# Patient Record
Sex: Female | Born: 1950
Health system: Southern US, Community
[De-identification: ages and names within clinical notes are randomized; demographics above are authoritative.]

## PROBLEM LIST (undated history)

## (undated) DIAGNOSIS — I10 Essential (primary) hypertension: Secondary | ICD-10-CM

## (undated) DIAGNOSIS — E669 Obesity, unspecified: Secondary | ICD-10-CM

## (undated) DIAGNOSIS — M8580 Other specified disorders of bone density and structure, unspecified site: Secondary | ICD-10-CM

## (undated) DIAGNOSIS — F329 Major depressive disorder, single episode, unspecified: Secondary | ICD-10-CM

## (undated) DIAGNOSIS — E785 Hyperlipidemia, unspecified: Secondary | ICD-10-CM

## (undated) DIAGNOSIS — F32A Depression, unspecified: Secondary | ICD-10-CM

## (undated) DIAGNOSIS — R7302 Impaired glucose tolerance (oral): Secondary | ICD-10-CM

## (undated) DIAGNOSIS — F321 Major depressive disorder, single episode, moderate: Secondary | ICD-10-CM

## (undated) HISTORY — DX: Impaired glucose tolerance (oral): R73.02

## (undated) HISTORY — DX: Hyperlipidemia, unspecified: E78.5

## (undated) HISTORY — PX: TUBAL LIGATION: SHX77

## (undated) HISTORY — DX: Other specified disorders of bone density and structure, unspecified site: M85.80

## (undated) HISTORY — DX: Essential (primary) hypertension: I10

## (undated) HISTORY — DX: Major depressive disorder, single episode, unspecified: F32.9

## (undated) HISTORY — DX: Depression, unspecified: F32.A

## (undated) HISTORY — PX: PARTIAL HYSTERECTOMY: SHX80

## (undated) HISTORY — DX: Obesity, unspecified: E66.9

---
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
## 1973-09-20 HISTORY — PX: ABDOMINAL HYSTERECTOMY: SHX81

## 1998-05-17 ENCOUNTER — Emergency Department (HOSPITAL_COMMUNITY): Admission: EM | Admit: 1998-05-17 | Discharge: 1998-05-17 | Payer: Self-pay | Admitting: Emergency Medicine

## 2000-03-20 ENCOUNTER — Emergency Department (HOSPITAL_COMMUNITY): Admission: EM | Admit: 2000-03-20 | Discharge: 2000-03-20 | Payer: Self-pay | Admitting: Emergency Medicine

## 2000-11-13 ENCOUNTER — Emergency Department (HOSPITAL_COMMUNITY): Admission: EM | Admit: 2000-11-13 | Discharge: 2000-11-13 | Payer: Self-pay | Admitting: Emergency Medicine

## 2000-11-13 ENCOUNTER — Encounter: Payer: Self-pay | Admitting: Emergency Medicine

## 2001-01-31 ENCOUNTER — Other Ambulatory Visit: Admission: RE | Admit: 2001-01-31 | Discharge: 2001-01-31 | Payer: Self-pay | Admitting: Family Medicine

## 2002-02-14 ENCOUNTER — Encounter: Payer: Self-pay | Admitting: Family Medicine

## 2002-02-14 ENCOUNTER — Ambulatory Visit (HOSPITAL_COMMUNITY): Admission: RE | Admit: 2002-02-14 | Discharge: 2002-02-14 | Payer: Self-pay | Admitting: Family Medicine

## 2003-04-05 ENCOUNTER — Ambulatory Visit (HOSPITAL_COMMUNITY): Admission: RE | Admit: 2003-04-05 | Discharge: 2003-04-05 | Payer: Self-pay | Admitting: Family Medicine

## 2003-04-05 ENCOUNTER — Encounter: Payer: Self-pay | Admitting: Family Medicine

## 2004-09-02 ENCOUNTER — Ambulatory Visit (HOSPITAL_COMMUNITY): Admission: RE | Admit: 2004-09-02 | Discharge: 2004-09-02 | Payer: Self-pay | Admitting: Family Medicine

## 2004-10-15 ENCOUNTER — Ambulatory Visit: Payer: Self-pay | Admitting: Family Medicine

## 2005-03-24 ENCOUNTER — Ambulatory Visit: Payer: Self-pay | Admitting: Family Medicine

## 2005-08-16 ENCOUNTER — Ambulatory Visit: Payer: Self-pay | Admitting: Family Medicine

## 2005-09-24 ENCOUNTER — Ambulatory Visit (HOSPITAL_COMMUNITY): Admission: RE | Admit: 2005-09-24 | Discharge: 2005-09-24 | Payer: Self-pay | Admitting: Family Medicine

## 2005-11-04 ENCOUNTER — Ambulatory Visit: Payer: Self-pay | Admitting: Family Medicine

## 2005-11-09 ENCOUNTER — Encounter (INDEPENDENT_AMBULATORY_CARE_PROVIDER_SITE_OTHER): Payer: Self-pay | Admitting: *Deleted

## 2005-11-09 LAB — CONVERTED CEMR LAB: Pap Smear: NORMAL

## 2006-03-16 ENCOUNTER — Ambulatory Visit: Payer: Self-pay | Admitting: Family Medicine

## 2006-06-25 ENCOUNTER — Emergency Department (HOSPITAL_COMMUNITY): Admission: EM | Admit: 2006-06-25 | Discharge: 2006-06-25 | Payer: Self-pay | Admitting: Emergency Medicine

## 2006-10-21 ENCOUNTER — Ambulatory Visit: Payer: Self-pay | Admitting: Family Medicine

## 2006-10-21 LAB — CONVERTED CEMR LAB
AST: 19 units/L (ref 0–37)
Alkaline Phosphatase: 77 units/L (ref 39–117)
BUN: 10 mg/dL (ref 6–23)
Basophils Absolute: 0 10*3/uL (ref 0.0–0.1)
Basophils Relative: 0 % (ref 0–1)
Calcium: 9.5 mg/dL (ref 8.4–10.5)
Cholesterol: 177 mg/dL (ref 0–200)
Creatinine, Ser: 0.62 mg/dL (ref 0.40–1.20)
Eosinophils Absolute: 0.2 10*3/uL (ref 0.0–0.7)
Eosinophils Relative: 2 % (ref 0–5)
Hemoglobin: 13.3 g/dL (ref 12.0–15.0)
Indirect Bilirubin: 0.4 mg/dL (ref 0.0–0.9)
LDL Cholesterol: 106 mg/dL — ABNORMAL HIGH (ref 0–99)
MCHC: 31.9 g/dL (ref 30.0–36.0)
MCV: 91.2 fL (ref 78.0–100.0)
Monocytes Absolute: 0.4 10*3/uL (ref 0.2–0.7)
Monocytes Relative: 5 % (ref 3–11)
RBC: 4.57 M/uL (ref 3.87–5.11)
RDW: 12.9 % (ref 11.5–14.0)
Total Protein: 7.2 g/dL (ref 6.0–8.3)
Triglycerides: 59 mg/dL (ref <150)

## 2007-02-08 ENCOUNTER — Ambulatory Visit (HOSPITAL_COMMUNITY): Admission: RE | Admit: 2007-02-08 | Discharge: 2007-02-08 | Payer: Self-pay | Admitting: Family Medicine

## 2007-03-01 ENCOUNTER — Ambulatory Visit (HOSPITAL_COMMUNITY): Admission: RE | Admit: 2007-03-01 | Discharge: 2007-03-01 | Payer: Self-pay | Admitting: Family Medicine

## 2007-04-04 ENCOUNTER — Ambulatory Visit: Payer: Self-pay | Admitting: Family Medicine

## 2007-04-04 ENCOUNTER — Encounter (INDEPENDENT_AMBULATORY_CARE_PROVIDER_SITE_OTHER): Payer: Self-pay | Admitting: *Deleted

## 2007-04-04 ENCOUNTER — Encounter: Payer: Self-pay | Admitting: Family Medicine

## 2007-04-04 ENCOUNTER — Other Ambulatory Visit: Admission: RE | Admit: 2007-04-04 | Discharge: 2007-04-04 | Payer: Self-pay | Admitting: Family Medicine

## 2007-04-04 LAB — CONVERTED CEMR LAB: Pap Smear: NORMAL

## 2007-04-05 ENCOUNTER — Encounter: Payer: Self-pay | Admitting: Family Medicine

## 2007-04-05 LAB — CONVERTED CEMR LAB
ALT: 17 units/L (ref 0–35)
BUN: 12 mg/dL (ref 6–23)
Bilirubin, Direct: 0.2 mg/dL (ref 0.0–0.3)
Cholesterol: 158 mg/dL (ref 0–200)
Creatinine, Ser: 0.72 mg/dL (ref 0.40–1.20)
Glucose, Bld: 97 mg/dL (ref 70–99)
Indirect Bilirubin: 0.2 mg/dL (ref 0.0–0.9)
LDL Cholesterol: 87 mg/dL (ref 0–99)
Potassium: 4.1 meq/L (ref 3.5–5.3)
VLDL: 13 mg/dL (ref 0–40)

## 2007-04-18 ENCOUNTER — Ambulatory Visit (HOSPITAL_COMMUNITY): Admission: RE | Admit: 2007-04-18 | Discharge: 2007-04-18 | Payer: Self-pay | Admitting: Family Medicine

## 2007-05-16 ENCOUNTER — Ambulatory Visit: Payer: Self-pay | Admitting: Family Medicine

## 2007-08-04 ENCOUNTER — Ambulatory Visit: Payer: Self-pay | Admitting: Family Medicine

## 2007-08-04 LAB — CONVERTED CEMR LAB
Bilirubin, Direct: 0.1 mg/dL (ref 0.0–0.3)
CO2: 28 meq/L (ref 19–32)
Glucose, Bld: 89 mg/dL (ref 70–99)
Potassium: 4 meq/L (ref 3.5–5.3)
Sodium: 141 meq/L (ref 135–145)
Total Bilirubin: 0.5 mg/dL (ref 0.3–1.2)
Total CHOL/HDL Ratio: 3
VLDL: 15 mg/dL (ref 0–40)

## 2007-09-11 ENCOUNTER — Encounter (INDEPENDENT_AMBULATORY_CARE_PROVIDER_SITE_OTHER): Payer: Self-pay | Admitting: *Deleted

## 2008-01-02 DIAGNOSIS — M949 Disorder of cartilage, unspecified: Secondary | ICD-10-CM

## 2008-01-02 DIAGNOSIS — E785 Hyperlipidemia, unspecified: Secondary | ICD-10-CM | POA: Insufficient documentation

## 2008-01-02 DIAGNOSIS — M899 Disorder of bone, unspecified: Secondary | ICD-10-CM | POA: Insufficient documentation

## 2008-01-02 DIAGNOSIS — E669 Obesity, unspecified: Secondary | ICD-10-CM

## 2008-01-02 DIAGNOSIS — I10 Essential (primary) hypertension: Secondary | ICD-10-CM | POA: Insufficient documentation

## 2008-01-03 ENCOUNTER — Ambulatory Visit: Payer: Self-pay | Admitting: Family Medicine

## 2008-01-03 LAB — CONVERTED CEMR LAB
Albumin: 4.6 g/dL (ref 3.5–5.2)
Alkaline Phosphatase: 79 units/L (ref 39–117)
Basophils Absolute: 0 10*3/uL (ref 0.0–0.1)
CO2: 26 meq/L (ref 19–32)
Chloride: 103 meq/L (ref 96–112)
Creatinine, Ser: 0.75 mg/dL (ref 0.40–1.20)
HDL: 63 mg/dL (ref >39)
Hemoglobin: 12.7 g/dL (ref 12.0–15.0)
LDL Cholesterol: 110 mg/dL — ABNORMAL HIGH (ref 0–99)
Lymphocytes Relative: 31 % (ref 12–46)
Monocytes Absolute: 0.4 10*3/uL (ref 0.1–1.0)
Monocytes Relative: 5 % (ref 3–12)
Neutro Abs: 5.1 10*3/uL (ref 1.7–7.7)
RBC: 4.35 M/uL (ref 3.87–5.11)
Total Bilirubin: 0.4 mg/dL (ref 0.3–1.2)
WBC: 8.4 10*3/uL (ref 4.0–10.5)

## 2008-06-28 ENCOUNTER — Ambulatory Visit: Payer: Self-pay | Admitting: Family Medicine

## 2008-06-28 LAB — CONVERTED CEMR LAB
AST: 16 units/L (ref 0–37)
Albumin: 4.5 g/dL (ref 3.5–5.2)
Alkaline Phosphatase: 79 units/L (ref 39–117)
Calcium: 9.4 mg/dL (ref 8.4–10.5)
Creatinine, Ser: 0.75 mg/dL (ref 0.40–1.20)
HDL: 62 mg/dL (ref >39)
Total Protein: 8.2 g/dL (ref 6.0–8.3)
Triglycerides: 70 mg/dL (ref <150)

## 2008-06-29 ENCOUNTER — Encounter: Payer: Self-pay | Admitting: Family Medicine

## 2008-10-30 ENCOUNTER — Telehealth: Payer: Self-pay | Admitting: Gastroenterology

## 2008-10-31 ENCOUNTER — Encounter (INDEPENDENT_AMBULATORY_CARE_PROVIDER_SITE_OTHER): Payer: Self-pay | Admitting: *Deleted

## 2008-11-25 ENCOUNTER — Encounter: Payer: Self-pay | Admitting: Family Medicine

## 2009-04-03 ENCOUNTER — Ambulatory Visit: Payer: Self-pay | Admitting: Family Medicine

## 2009-04-03 DIAGNOSIS — F329 Major depressive disorder, single episode, unspecified: Secondary | ICD-10-CM

## 2009-04-04 LAB — CONVERTED CEMR LAB
ALT: 15 units/L (ref 0–35)
AST: 17 units/L (ref 0–37)
Alkaline Phosphatase: 78 units/L (ref 39–117)
BUN: 13 mg/dL (ref 6–23)
Basophils Absolute: 0 10*3/uL (ref 0.0–0.1)
Basophils Relative: 0 % (ref 0–1)
Calcium: 9.1 mg/dL (ref 8.4–10.5)
Cholesterol: 157 mg/dL (ref 0–200)
Eosinophils Relative: 3 % (ref 0–5)
HCT: 40.8 % (ref 36.0–46.0)
Indirect Bilirubin: 0.4 mg/dL (ref 0.0–0.9)
Lymphocytes Relative: 35 % (ref 12–46)
Neutro Abs: 4 10*3/uL (ref 1.7–7.7)
Platelets: 287 10*3/uL (ref 150–400)
Potassium: 4.2 meq/L (ref 3.5–5.3)
RDW: 13.2 % (ref 11.5–15.5)
Total Protein: 7 g/dL (ref 6.0–8.3)
Triglycerides: 76 mg/dL (ref <150)
VLDL: 15 mg/dL (ref 0–40)

## 2009-04-10 ENCOUNTER — Telehealth: Payer: Self-pay | Admitting: Family Medicine

## 2009-11-06 ENCOUNTER — Ambulatory Visit: Payer: Self-pay | Admitting: Family Medicine

## 2009-11-07 ENCOUNTER — Encounter: Payer: Self-pay | Admitting: Family Medicine

## 2009-11-07 LAB — CONVERTED CEMR LAB
ALT: 22 units/L (ref 0–35)
AST: 21 units/L (ref 0–37)
BUN: 10 mg/dL (ref 6–23)
Bilirubin, Direct: 0.1 mg/dL (ref 0.0–0.3)
CO2: 28 meq/L (ref 19–32)
Calcium: 9.2 mg/dL (ref 8.4–10.5)
Cholesterol: 165 mg/dL (ref 0–200)
Creatinine, Ser: 0.62 mg/dL (ref 0.40–1.20)
Glucose, Bld: 87 mg/dL (ref 70–99)
Indirect Bilirubin: 0.5 mg/dL (ref 0.0–0.9)
Total Bilirubin: 0.6 mg/dL (ref 0.3–1.2)

## 2009-11-10 ENCOUNTER — Telehealth: Payer: Self-pay | Admitting: Family Medicine

## 2009-12-02 ENCOUNTER — Telehealth: Payer: Self-pay | Admitting: Family Medicine

## 2010-01-21 ENCOUNTER — Telehealth: Payer: Self-pay | Admitting: Gastroenterology

## 2010-06-09 ENCOUNTER — Telehealth: Payer: Self-pay | Admitting: Family Medicine

## 2010-06-30 ENCOUNTER — Ambulatory Visit: Payer: Self-pay | Admitting: Family Medicine

## 2010-07-01 LAB — CONVERTED CEMR LAB
ALT: 15 units/L (ref 0–35)
AST: 16 units/L (ref 0–37)
Albumin: 4.5 g/dL (ref 3.5–5.2)
Alkaline Phosphatase: 82 units/L (ref 39–117)
BUN: 11 mg/dL (ref 6–23)
Basophils Absolute: 0 10*3/uL (ref 0.0–0.1)
Basophils Relative: 0 % (ref 0–1)
Bilirubin, Direct: 0.3 mg/dL (ref 0.0–0.3)
CO2: 29 meq/L (ref 19–32)
Calcium: 9.7 mg/dL (ref 8.4–10.5)
Chloride: 104 meq/L (ref 96–112)
Cholesterol: 168 mg/dL (ref 0–200)
Creatinine, Ser: 0.67 mg/dL (ref 0.40–1.20)
Eosinophils Absolute: 0.2 10*3/uL (ref 0.0–0.7)
Eosinophils Relative: 3 % (ref 0–5)
Glucose, Bld: 94 mg/dL (ref 70–99)
HCT: 41.2 % (ref 36.0–46.0)
HDL: 56 mg/dL (ref >39)
Hemoglobin: 13.3 g/dL (ref 12.0–15.0)
Indirect Bilirubin: 0.1 mg/dL (ref 0.0–0.9)
LDL Cholesterol: 100 mg/dL — ABNORMAL HIGH (ref 0–99)
Lymphocytes Relative: 28 % (ref 12–46)
Lymphs Abs: 2.3 10*3/uL (ref 0.7–4.0)
MCHC: 32.3 g/dL (ref 30.0–36.0)
MCV: 91.4 fL (ref 78.0–100.0)
Monocytes Absolute: 0.5 10*3/uL (ref 0.1–1.0)
Monocytes Relative: 6 % (ref 3–12)
Neutro Abs: 5.2 10*3/uL (ref 1.7–7.7)
Neutrophils Relative %: 63 % (ref 43–77)
Platelets: 312 10*3/uL (ref 150–400)
Potassium: 4.1 meq/L (ref 3.5–5.3)
RBC: 4.51 M/uL (ref 3.87–5.11)
RDW: 13.1 % (ref 11.5–15.5)
Sodium: 142 meq/L (ref 135–145)
TSH: 1.163 microintl units/mL (ref 0.350–4.500)
Total Bilirubin: 0.4 mg/dL (ref 0.3–1.2)
Total CHOL/HDL Ratio: 3
Total Protein: 7.7 g/dL (ref 6.0–8.3)
Triglycerides: 60 mg/dL (ref <150)
VLDL: 12 mg/dL (ref 0–40)
Vit D, 25-Hydroxy: 22 ng/mL — ABNORMAL LOW (ref 30–89)
WBC: 8.2 10*3/uL (ref 4.0–10.5)

## 2010-07-07 ENCOUNTER — Encounter: Payer: Self-pay | Admitting: Family Medicine

## 2010-07-09 ENCOUNTER — Telehealth: Payer: Self-pay | Admitting: Family Medicine

## 2010-10-10 ENCOUNTER — Encounter: Payer: Self-pay | Admitting: Family Medicine

## 2010-10-11 ENCOUNTER — Encounter: Payer: Self-pay | Admitting: Family Medicine

## 2010-10-12 ENCOUNTER — Encounter: Payer: Self-pay | Admitting: Family Medicine

## 2010-10-20 NOTE — Letter (Signed)
Summary: [facility]   Imported By: Lind Guest 07/07/2010 14:42:59  _____________________________________________________________________  External Attachment:    Type:   Image     Comment:   External Document

## 2010-10-20 NOTE — Progress Notes (Signed)
Summary: medication  Phone Note Call from Patient   Summary of Call: having trouble with medicine. talking about phentermine. not taking now. can you give her something to have bowel movement. [phone] Initial call taken by: Rudene Anda,  December 02, 2009 10:29 AM  Follow-up for Phone Call        advise all laxatives are otc and she may follow the directions, she may use dulcolax tablet or suppositories, alsoshe may use milk of magnesia or magnesium citrate is the strongest. i recommend most the dulcolax  Follow-up by: Syliva Overman MD,  December 03, 2009 12:44 PM  Additional Follow-up for Phone Call Additional follow up Details #1::        Phone Call Completed Additional Follow-up by: Adella Hare LPN,  December 03, 2009 1:28 PM

## 2010-10-20 NOTE — Progress Notes (Signed)
Summary: simvastatin  Phone Note Call from Patient   Summary of Call: patient called in and wanted to know if she could cut her simastain in half, she states she has some 80 mg at home.  She is only suppose to take 40 mg.  I went and spoke to Craig and she said to advise patient that if she wanted to take the 80mg  to do so every other day.   Initial call taken by: Curtis Sites,  July 09, 2010 2:20 PM

## 2010-10-20 NOTE — Letter (Signed)
Summary: med review sheet  med review sheet   Imported By: Rudene Anda 06/30/2010 11:55:12  _____________________________________________________________________  External Attachment:    Type:   Image     Comment:   External Document

## 2010-10-20 NOTE — Progress Notes (Signed)
  Phone Note Call from Patient   Caller: Patient Summary of Call: patient states you were gonna start her on a depression/anxiety med, her discharge summary also states this, however no med was ordered Initial call taken by: Adella Hare LPN,  November 10, 2009 11:30 AM  Follow-up for Phone Call        advise sorry, and let her know med sent in , venlafexine Follow-up by: Syliva Overman MD,  November 10, 2009 12:11 PM  Additional Follow-up for Phone Call Additional follow up Details #1::        Rx Called In, patient aware Additional Follow-up by: Adella Hare LPN,  November 10, 2009 4:23 PM    New/Updated Medications: VENLAFAXINE HCL 37.5 MG TABS (VENLAFAXINE HCL) Take 1 tablet by mouth two times a day Prescriptions: VENLAFAXINE HCL 37.5 MG TABS (VENLAFAXINE HCL) Take 1 tablet by mouth two times a day  #60 x 3   Entered and Authorized by:   Syliva Overman MD   Signed by:   Syliva Overman MD on 11/10/2009   Method used:   [facility]  [address] [id number]       [address]       [place], [place]  [address]       Ph: [phone] or [phone]       Fax: [phone]   RxID:   [id number]

## 2010-10-20 NOTE — Progress Notes (Signed)
Summary: MEDICINE  Phone Note Call from Patient   Summary of Call: NEEDS [facility] [address] Initial call taken by: Lind Guest,  June 09, 2010 10:01 AM    Prescriptions: SIMVASTATIN 80 MG  TABS (SIMVASTATIN) one tab by mouth once daily  #30 Tablet x 0   Entered by:   Adella Hare LPN   Authorized by:   Syliva Overman MD   Signed by:   Adella Hare LPN on 47/82/9562   Method used:   [facility]  [address] [id number]       [address]       [place], [place]  [address]       Ph: [phone] or [phone]       Fax: [phone]   RxID:   [id number]

## 2010-10-20 NOTE — Assessment & Plan Note (Signed)
Summary: office visit   Vital Signs:  Patient profile:   60 year old female Menstrual status:  hysterectomy Height:      64 inches Weight:      200.25 pounds BMI:     34.50 O2 Sat:      96 % Pulse rate:   92 / minute Pulse rhythm:   regular Resp:     16 per minute BP sitting:   110 / 70  (left arm) Cuff size:   large  Vitals Entered By: Everitt Amber LPN (November 06, 2009 1:01 PM)  Nutrition Counseling: Patient's BMI is greater than 25 and therefore counseled on weight management options. CC: Follow up chronic problems Is Patient Diabetic? No   CC:  Follow up chronic problems.  History of Present Illness: Reports  that she has been  doing well. Denies recent fever or chills. Denies sinus pressure, nasal congestion , ear pain or sore throat. Denies chest congestion, or cough productive of sputum. Denies chest pain, palpitations, PND, orthopnea or leg swelling. Denies abdominal pain, nausea, vomitting, diarrhea or constipation. Denies change in bowel movements or bloody stool. Denies dysuria , frequency, incontinence or hesitancy. Denies  joint pain, swelling, or reduced mobility. Denies headaches, vertigo, seizures.reports worsened depresssion, anxiety aand wants to resume meds. Life stresses are getting yto her, she is not suicidal or homicidal.  She is smoking more than in the past top her distress, she wants to lose weight.  Denies  rash, lesions, or itch.     Preventive Screening-Counseling & Management  Alcohol-Tobacco     Smoking Cessation Counseling: yes  Current Medications (verified): 1)  Simvastatin 80 Mg  Tabs (Simvastatin) .... One Tab By Mouth Once Daily 2)  Calcium 600/vitamin D 600-400 Mg-Unit  Tabs (Calcium Carbonate-Vitamin D) .... One Tab By Mouth Once Daily 3)  Aspirin 81 Mg  Tbec (Aspirin) .... One Tab By Mouth Once Daily 4)  Wellbutrin Sr 150 Mg Xr12h-Tab (Bupropion Hcl) .... One Tab By Mouth Qd 5)  Diovan Hct 160-25 Mg Tabs  (Valsartan-Hydrochlorothiazide) .... Take 1 Tablet By Mouth Once A Day  Allergies (verified): No Known Drug Allergies  Review of Systems Eyes:  Denies blurring and discharge. Psych:  Complains of anxiety and depression; denies easily tearful, irritability, mental problems, suicidal thoughts/plans, thoughts of violence, and unusual visions or sounds. Endo:  Denies cold intolerance, excessive hunger, excessive thirst, excessive urination, heat intolerance, polyuria, and weight change. Heme:  Denies abnormal bruising and bleeding. Allergy:  Denies hives or rash and sneezing.  Physical Exam  General:  alert, well-hydrated, and overweight-appearing. HEENT: No facial asymmetry,  EOMI, No sinus tenderness,  oropharynx  pink and moist.tM clear  Chest: Clear to auscultation bilaterally. decreased air entry bilaterally  CVS: S1, S2, No murmurs, No S3.   Abd: Soft, Nontender. obese MS: Adequate ROM spine, hips, shoulders and knees.  Ext: No edema.   CNS: CN 2-12 intact, power tone and sensation normal throughout.   Skin: Intact, no visible lesions or rashes.  Psych: Good eye contact, normal affect.  Memory intact,both anxious and depressed appearing.      Impression & Recommendations:  Problem # 1:  DEPRESSION, RECURRENT (ICD-311) Assessment Deteriorated  The following medications were removed from the medication list:    Wellbutrin Sr 150 Mg Xr12h-tab (Bupropion hcl) ..... One tab by mouth once daily new med of velafexine started  Problem # 2:  OBESITY (ICD-278.00) Assessment: Deteriorated  Ht: 64 (11/06/2009)   Wt: 200.25 (11/06/2009)  BMI: 34.50 (11/06/2009), lifestyle chabmge encouraged and discussed and pt to start phentermine half daily  Problem # 3:  HYPERTENSION (ICD-401.9) Assessment: Improved  Her updated medication list for this problem includes:    Diovan Hct 160-25 Mg Tabs (Valsartan-hydrochlorothiazide) .Marland Kitchen... Take 1 tablet by mouth once a day  Orders: T-Basic  Metabolic Panel [phone])  BP today: 110/70 Prior BP: 122/80 (04/03/2009)  Labs Reviewed: K+: 4.2 (04/03/2009) Creat: : 0.68 (04/03/2009)   Chol: 157 (04/03/2009)   HDL: 52 (04/03/2009)   LDL: 90 (04/03/2009)   TG: 76 (04/03/2009)  Problem # 4:  HYPERLIPIDEMIA (ICD-272.4) Assessment: Comment Only  Her updated medication list for this problem includes:    Simvastatin 80 Mg Tabs (Simvastatin) ..... One tab by mouth once daily  Orders: T-Hepatic Function [id number]) T-Lipid Profile [phone])  Labs Reviewed: SGOT: 17 (04/03/2009)   SGPT: 15 (04/03/2009)   HDL:52 (04/03/2009), 62 (06/28/2008)  LDL:90 (04/03/2009), 97 (06/28/2008)  Chol:157 (04/03/2009), 173 (06/28/2008)  Trig:76 (04/03/2009), 70 (06/28/2008)  Complete Medication List: 1)  Simvastatin 80 Mg Tabs (Simvastatin) .... One tab by mouth once daily 2)  Calcium 600/vitamin D 600-400 Mg-unit Tabs (Calcium carbonate-vitamin d) .... One tab by mouth once daily 3)  Aspirin 81 Mg Tbec (Aspirin) .... One tab by mouth once daily 4)  Diovan Hct 160-25 Mg Tabs (Valsartan-hydrochlorothiazide) .... Take 1 tablet by mouth once a day 5)  Phentermine Hcl 37.5 Mg Tabs (Phentermine hcl) .... Take 1 tablet by mouth once a day 6)  Venlafaxine Hcl 37.5 Mg Tabs (Venlafaxine hcl) .... Take 1 tablet by mouth two times a day  Patient Instructions: 1)  Please schedule a follow-up appointment in 2 months. 2)  It is important that you exercise regularly at least 20 minutes 5 times a week. If you develop chest pain, have severe difficulty breathing, or feel very tired , stop exercising immediately and seek medical attention. 3)  You need to lose weight. Consider a lower calorie diet and regular exercise.  4)  Tobacco is very bad for your health and your loved ones! You Should stop smoking!. 5)  Stop Smoking Tips: Choose a Quit date. Cut down before the Quit date. decide what you will do as a substitute when you feel the urge to  smoke(gum,toothpick,exercise). 6)  You will be started on medication again for depression and anxxiety. 7)  You will be started on apetite suppressant , pls take  HALF tablet instead of a whole. 8)  PLS call for a free mamogram  later this year, explain this to [facility] cone. we will help with this Prescriptions: DIOVAN HCT 160-25 MG TABS (VALSARTAN-HYDROCHLOROTHIAZIDE) Take 1 tablet by mouth once a day  #30 x 5   Entered by:   Everitt Amber LPN   Authorized by:   Syliva Overman MD   Signed by:   Everitt Amber LPN on 10/93/2355   Method used:   [facility]        [facility]  [address] [id number]       [address]       [place], [place]  [address]       Ph: [phone] or [phone]       Fax: [phone]   RxID:   [id number] SIMVASTATIN 80 MG  TABS (SIMVASTATIN) one tab by mouth once daily  #30 Tablet x 5   Entered by:   Everitt Amber LPN   Authorized by:   Syliva Overman MD  Signed by:   Everitt Amber LPN on 94/85/4627   Method used:   [facility]        [facility]  [address] [id number]       [address]       [place], [place]  [address]       Ph: [phone] or [phone]       Fax: [phone]   RxID:   [id number] PHENTERMINE HCL 37.5 MG TABS (PHENTERMINE HCL) Take 1 tablet by mouth once a day  #30 x 0   Entered and Authorized by:   Syliva Overman MD   Signed by:   Syliva Overman MD on 11/06/2009   Method used:   Printed then faxed to ...       [facility]  [address] [id number]       [address]       [place], [place]  [address]       Ph: [phone] or [phone]       Fax: [phone]   RxID:   [id number]

## 2010-10-20 NOTE — Progress Notes (Signed)
Summary: Schedule Colonoscopy  Phone Note Outgoing Call Call back at [facility] Phone [phone]   Call placed by: Harlow Mares CMA [facility]),  Jan 21, 2010 9:07 AM Call placed to: Patient Summary of Call: pt due for colonoscopy she states she is interested in her colonoscopy but she will not schedule at this time then she hung up on me.  Initial call taken by: Harlow Mares CMA [facility]),  Jan 21, 2010 9:07 AM

## 2010-10-20 NOTE — Assessment & Plan Note (Signed)
Summary: OV   Vital Signs:  Patient profile:   60 year old female Menstrual status:  hysterectomy Height:      64 inches Weight:      200.25 pounds BMI:     34.50 O2 Sat:      96 % on Room air Pulse rate:   87 / minute Pulse rhythm:   irregular Resp:     16 per minute BP sitting:   118 / 82  (left arm)  Vitals Entered By: Mauricia Area, CMA  Nutrition Counseling: Patient's BMI is greater than 25 and therefore counseled on weight management options.  O2 Flow:  Room air CC: follow up Comments did not bring meds   CC:  follow up.  History of Present Illness: Reports  that she has been doing fairly well. she has been unable to keep up with recommended screening because of financial issues , but will work on this. She recently lost her mother to lung cancer, and isstill adjusting to this. she continues to smoke intermittently. Denies recent fever or chills. Denies sinus pressure, nasal congestion , ear pain or sore throat. Denies chest congestion, or cough productive of sputum. Denies chest pain, palpitations, PND, orthopnea or leg swelling. Denies abdominal pain, nausea, vomitting, diarrhea or constipation. Denies change in bowel movements or bloody stool. Denies dysuria , frequency, incontinence or hesitancy. Denies  joint pain, swelling, or reduced mobility. Denies headaches, vertigo, seizures. Reports mild depression, primarily due to grief rxn, does not want meds Denies  rash, lesions, or itch.     Allergies (verified): No Known Drug Allergies  Family History: Mom deceasd age 71, in Feb 11, 2010, lung cancer Dad deceased Brain aneurysm, Two sisters whom are HTN,and two brothers that are heathly  Review of Systems      See HPI General:  Complains of fatigue, malaise, and sleep disorder; currently grieving mother's recent passing. Eyes:  Denies blurring and discharge. Endo:  Denies cold intolerance, excessive hunger, excessive thirst, and excessive urination. Heme:   Denies abnormal bruising and bleeding. Allergy:  Denies hives or rash and itching eyes.  Physical Exam  General:  Well-developed,obese,in no acute distress; alert,appropriate and cooperative throughout examination HEENT: No facial asymmetry,  EOMI, No sinus tenderness, TM's Clear, oropharynx  pink and moist.   Chest: Clear to auscultation bilaterally. decreased air entry throughout CVS: S1, S2, No murmurs, No S3.   Abd: Soft, Nontender.  MS: Adequate ROM spine, hips, shoulders and knees.  Ext: No edema.   CNS: CN 2-12 intact, power tone and sensation normal throughout.   Skin: Intact, no visible lesions or rashes.  Psych: Good eye contact, normal affect.  Memory intact, not anxious or depressed appearing.    Impression & Recommendations:  Problem # 1:  OBESITY (ICD-278.00) Assessment Unchanged  Ht: 64 (06/30/2010)   Wt: 200.25 (06/30/2010)   BMI: 34.50 (06/30/2010) therapeutic lifestyle change discussed and encouraged  Problem # 2:  HYPERLIPIDEMIA (ICD-272.4) Assessment: Comment Only  The following medications were removed from the medication list:    Simvastatin 80 Mg Tabs (Simvastatin) ..... One tab by mouth once daily Her updated medication list for this problem includes:    Simvastatin 40 Mg Tabs (Simvastatin) .Marland Kitchen... Take 1 tab by mouth at bedtime, dose reduced based on recent fDA guidelines, will need to contact pt about this then the pharmacy. Low fat dietdiscussed and encouraged  Orders: T-Lipid Profile [phone]) T-Hepatic Function [id number])  Labs Reviewed: SGOT: 21 (11/07/2009)   SGPT: 22 (11/07/2009)   HDL:59 (  11/07/2009), 52 (04/03/2009)  LDL:94 (11/07/2009), 90 (04/03/2009)  Chol:165 (11/07/2009), 157 (04/03/2009)  Trig:60 (11/07/2009), 76 (04/03/2009)  Problem # 3:  HYPERTENSION (ICD-401.9) Assessment: Unchanged  Her updated medication list for this problem includes:    Diovan Hct 160-25 Mg Tabs (Valsartan-hydrochlorothiazide) .Marland Kitchen... Take 1 tablet by  mouth once a day  Orders: T-Basic Metabolic Panel [phone])  BP today: 118/82 Prior BP: 110/70 (11/06/2009)  Labs Reviewed: K+: 3.9 (11/07/2009) Creat: : 0.62 (11/07/2009)   Chol: 165 (11/07/2009)   HDL: 59 (11/07/2009)   LDL: 94 (11/07/2009)   TG: 60 (11/07/2009)  Complete Medication List: 1)  Calcium 600/vitamin D 600-400 Mg-unit Tabs (Calcium carbonate-vitamin d) .... One tab by mouth once daily 2)  Aspirin 81 Mg Tbec (Aspirin) .... One tab by mouth once daily 3)  Diovan Hct 160-25 Mg Tabs (Valsartan-hydrochlorothiazide) .... Take 1 tablet by mouth once a day 4)  Simvastatin 40 Mg Tabs (Simvastatin) .... Take 1 tab by mouth at bedtime  Other Orders: T-CBC w/Diff [phone]) T-TSH [phone]) T-Vitamin D (25-Hydroxy) [phone])  Patient Instructions: 1)  CPE in 5.5 months. 2)  Pls accept my condolence re your Mom's passing 3)  BMP prior to visit, ICD-9: 4)  Hepatic Panel prior to visit, ICD-9: 5)  Lipid Panel prior to visit, ICD-9:   fasting today 6)  TSH prior to visit, ICD-9: 7)  CBC w/ Diff prior to visit, ICD-9: 8)  Vit D 9)  You need to have a mamogram and colonscopy.pls sched asap Prescriptions: SIMVASTATIN 40 MG TABS (SIMVASTATIN) Take 1 tab by mouth at bedtime  #30 x 5   Entered and Authorized by:   Syliva Overman MD   Signed by:   Syliva Overman MD on 07/04/2010   Method used:   Historical   RxID:   [id number] DIOVAN HCT 160-25 MG TABS (VALSARTAN-HYDROCHLOROTHIAZIDE) Take 1 tablet by mouth once a day  #30 x 5   Entered by:   Adella Hare LPN   Authorized by:   Syliva Overman MD   Signed by:   Adella Hare LPN on 89/38/1017   Method used:   [facility]  [address] [id number]       [address]       [place], [place]  [address]       Ph: [phone] or [phone]       Fax: [phone]   RxID:   [id number] SIMVASTATIN 80 MG  TABS (SIMVASTATIN) one tab by mouth once daily  #30 Tablet x  5   Entered by:   Adella Hare LPN   Authorized by:   Syliva Overman MD   Signed by:   Adella Hare LPN on 53/97/6734   Method used:   [facility]ronically to        [facility]  [address] [id number]       [address]       [place], [place]  [address]       Ph: [phone] or [phone]       Fax: [phone]   RxID:   [id number]   Appended Document: OV pls let pt know that based on recent recommendations by the fDA , i recommend reduction in her simvastatin dose from 80mg  to 40mg  one at bedtime, she can split ifpossible the 80mg  tabs she has or take every other day till done, a new script for 40mg   one at night is entered and needs to be faxed to her pharm after speaking with her, pls fax the note i have also written to the pharmacy, thanks  Appended Document: OV patient aware and letter sent

## 2010-12-25 ENCOUNTER — Ambulatory Visit: Payer: Self-pay | Admitting: Family Medicine

## 2010-12-29 ENCOUNTER — Encounter: Payer: Self-pay | Admitting: Family Medicine

## 2010-12-31 ENCOUNTER — Ambulatory Visit (INDEPENDENT_AMBULATORY_CARE_PROVIDER_SITE_OTHER): Payer: BLUE CROSS/BLUE SHIELD | Admitting: Family Medicine

## 2010-12-31 ENCOUNTER — Encounter: Payer: Self-pay | Admitting: Family Medicine

## 2010-12-31 VITALS — BP 120/80 | HR 84 | Resp 16 | Ht 64.0 in | Wt 204.0 lb

## 2010-12-31 DIAGNOSIS — Z23 Encounter for immunization: Secondary | ICD-10-CM

## 2010-12-31 DIAGNOSIS — E785 Hyperlipidemia, unspecified: Secondary | ICD-10-CM

## 2010-12-31 DIAGNOSIS — Z298 Encounter for other specified prophylactic measures: Secondary | ICD-10-CM

## 2010-12-31 DIAGNOSIS — I10 Essential (primary) hypertension: Secondary | ICD-10-CM

## 2010-12-31 DIAGNOSIS — F329 Major depressive disorder, single episode, unspecified: Secondary | ICD-10-CM

## 2010-12-31 DIAGNOSIS — E559 Vitamin D deficiency, unspecified: Secondary | ICD-10-CM

## 2010-12-31 MED ORDER — LOSARTAN POTASSIUM-HCTZ 100-25 MG PO TABS: 1.0000 | ORAL_TABLET | Freq: Every day | ORAL | Status: DC

## 2010-12-31 MED ORDER — BUPROPION HCL ER (SR) 100 MG PO TB12: 100.0000 mg | ORAL_TABLET | Freq: Two times a day (BID) | ORAL | Status: DC

## 2010-12-31 MED ORDER — SIMVASTATIN 40 MG PO TABS: 40.0000 mg | ORAL_TABLET | Freq: Every day | ORAL | Status: DC

## 2010-12-31 MED ORDER — ZOSTER VACCINE LIVE 19400 UNT/0.65ML ~~LOC~~ SOLR: 0.6500 mL | Freq: Once | SUBCUTANEOUS | Status: DC

## 2010-12-31 NOTE — Patient Instructions (Addendum)
CPE in 4 months.  Fasting chem 7, lipid, hepatic, vitamin D  Asap.  New blood pressure med, stop the diovan/hctz when done, new is hyzaar, call if too expensive  New med for depression medication , which will also help with smoking cessation.   Your mammogram will be scheduled

## 2011-01-10 ENCOUNTER — Encounter: Payer: Self-pay | Admitting: Family Medicine

## 2011-01-10 NOTE — Progress Notes (Signed)
  Subjective:    Patient ID: Amanda Simon, female    DOB: 02-06-1951, 60 y.o.   MRN: [id number]  HPI The PT is here for follow up and re-evaluation of chronic medical conditions, medication management and review of recent lab and radiology data.  Preventive health is updated, specifically  Cancer screening, Osteoporosis screening and Immunization.   Questions or concerns regarding consultations or procedures which the PT has had in the interim are  addressed. The PT denies any adverse reactions to current medications since the last visit.  She reports increased anxiety and depression and has started smoking again    Review of Systems Denies recent fever or chills. Denies sinus pressure, nasal congestion, ear pain or sore throat. Denies chest congestion, productive cough or wheezing. Denies chest pains, palpitations, paroxysmal nocturnal dyspnea, orthopnea and leg swelling Denies abdominal pain, nausea, vomiting,diarrhea or constipation.  Denies rectal bleeding or change in bowel movement. Denies dysuria, frequency, hesitancy or incontinence. Denies joint pain, swelling and limitation and mobility. Denies headaches, seizure, numbness, or tingling. Reports increased depression and  anxiety , denies insomnia.She is not suicidal, or homicidal Denies skin break down or rash.        Objective:   Physical Exam Patient alert and oriented and in no Cardiopulmonary distress.  HEENT: No facial asymmetry, EOMI, no sinus tenderness, TM's clear, Oropharynx pink and moist.  Neck supple no adenopathy.  Chest: Clear to auscultation bilaterally.Decreased air entry throughout  CVS: S1, S2 no murmurs, no S3.  ABD: Soft non tender. Bowel sounds normal.  Ext: No edema  MS: Adequate ROM spine, shoulders, hips and knees.  Skin: Intact, no ulcerations or rash noted.  Psych: Good eye contact, normal affect. Memory intact not anxious or depressed appearing.  CNS: CN 2-12 intact, power, tone  and sensation normal throughout.        Assessment & Plan:  1.Hypertension:Controlled, changes in medication due to cost , pt request 2. Nicotine : deteriorated 3. Hyperlipidemia; Hyperlipidemia:Low fat diet discussed and encouraged. Check labs 4.Depression and anxiety , deteriorated, no need for counselling , but will benefit from medication

## 2011-05-04 ENCOUNTER — Encounter: Payer: BLUE CROSS/BLUE SHIELD | Admitting: Family Medicine

## 2011-07-08 ENCOUNTER — Encounter: Payer: Self-pay | Admitting: Family Medicine

## 2011-07-13 ENCOUNTER — Ambulatory Visit: Payer: BLUE CROSS/BLUE SHIELD | Admitting: Family Medicine

## 2011-07-15 ENCOUNTER — Encounter: Payer: Self-pay | Admitting: Family Medicine

## 2011-07-27 LAB — HEPATIC FUNCTION PANEL
ALT: 17 U/L (ref 0–35)
Alkaline Phosphatase: 88 U/L (ref 39–117)
Indirect Bilirubin: 0.5 mg/dL (ref 0.0–0.9)
Total Protein: 7.6 g/dL (ref 6.0–8.3)

## 2011-07-27 LAB — BASIC METABOLIC PANEL
CO2: 28 mEq/L (ref 19–32)
Chloride: 103 mEq/L (ref 96–112)
Potassium: 4.1 mEq/L (ref 3.5–5.3)
Sodium: 139 mEq/L (ref 135–145)

## 2011-07-27 LAB — LIPID PANEL
HDL: 51 mg/dL (ref >39)
LDL Cholesterol: 126 mg/dL — ABNORMAL HIGH (ref 0–99)

## 2011-07-27 LAB — VITAMIN D 25 HYDROXY (VIT D DEFICIENCY, FRACTURES): Vit D, 25-Hydroxy: 25 ng/mL — ABNORMAL LOW (ref 30–89)

## 2011-07-28 ENCOUNTER — Encounter: Payer: Self-pay | Admitting: Family Medicine

## 2011-07-30 ENCOUNTER — Other Ambulatory Visit: Payer: Self-pay | Admitting: Family Medicine

## 2011-07-30 ENCOUNTER — Encounter: Payer: Self-pay | Admitting: Family Medicine

## 2011-07-30 DIAGNOSIS — Z139 Encounter for screening, unspecified: Secondary | ICD-10-CM

## 2011-08-02 ENCOUNTER — Ambulatory Visit (INDEPENDENT_AMBULATORY_CARE_PROVIDER_SITE_OTHER): Payer: BC Managed Care – PPO | Admitting: Family Medicine

## 2011-08-02 ENCOUNTER — Encounter: Payer: Self-pay | Admitting: Family Medicine

## 2011-08-02 VITALS — BP 120/70 | HR 89 | Resp 16 | Ht 64.0 in | Wt 207.1 lb

## 2011-08-02 DIAGNOSIS — F329 Major depressive disorder, single episode, unspecified: Secondary | ICD-10-CM

## 2011-08-02 DIAGNOSIS — E785 Hyperlipidemia, unspecified: Secondary | ICD-10-CM

## 2011-08-02 DIAGNOSIS — R5381 Other malaise: Secondary | ICD-10-CM

## 2011-08-02 DIAGNOSIS — E669 Obesity, unspecified: Secondary | ICD-10-CM

## 2011-08-02 DIAGNOSIS — R5383 Other fatigue: Secondary | ICD-10-CM

## 2011-08-02 DIAGNOSIS — I10 Essential (primary) hypertension: Secondary | ICD-10-CM

## 2011-08-02 MED ORDER — PHENTERMINE HCL 37.5 MG PO TABS: 37.5000 mg | ORAL_TABLET | Freq: Every day | ORAL | Status: DC

## 2011-08-02 NOTE — Progress Notes (Signed)
  Subjective:    Patient ID: Amanda Simon, female    DOB: Nov 10, 1950, 60 y.o.   MRN: [id number]  HPI The PT is here for follow up and re-evaluation of chronic medical conditions, medication management and review of any available recent lab and radiology data.  Preventive health is updated, specifically  Cancer screening and Immunization. Mammogram is upcoming, has held off due to finances, colonoscopy will be done next year after she gets medicare  Questions or concerns regarding consultations or procedures which the PT has had in the interim are  addressed. The PT denies any adverse reactions to current medications since the last visit.  There are no new concerns. States she no longer  buys cigarettes, just smokes when someone brings them into her home. Has stopped antidepressant, denies any overt symptoms of depression when specifically questioned Concerned about weight gain and wants appetite suppressant    Review of Systems See HPI Denies recent fever or chills. Denies sinus pressure, nasal congestion, ear pain or sore throat. Denies chest congestion, productive cough or wheezing. Denies chest pains, palpitations and leg swelling Denies abdominal pain, nausea, vomiting,diarrhea or constipation.   Denies dysuria, frequency, hesitancy or incontinence. Denies joint pain, swelling and limitation in mobility. Denies headaches, seizures, numbness, or tingling.  Denies skin break down or rash.        Objective:   Physical Exam Patient alert and oriented and in no cardiopulmonary distress.  HEENT: No facial asymmetry, EOMI, no sinus tenderness,  oropharynx pink and moist.  Neck supple no adenopathy.  Chest: Clear to auscultation bilaterally.Decreased air entry throughout  CVS: S1, S2 no murmurs, no S3.  ABD: Soft non tender. Bowel sounds normal.  Ext: No edema  MS: Adequate ROM spine, shoulders, hips and knees.  Skin: Intact, no ulcerations or rash noted.  Psych: Good  eye contact, normal affect. Memory intact not anxious or depressed appearing.  CNS: CN 2-12 intact, power, tone and sensation normal throughout.        Assessment & Plan:

## 2011-08-02 NOTE — Patient Instructions (Addendum)
F/u in 4 months   It is important that you exercise regularly at least 30 minutes 5 times a week. If you develop chest pain, have severe difficulty breathing, or feel very tired, stop exercising immediately and seek medical attention    A healthy diet is rich in fruit, vegetables and whole grains. Poultry fish, nuts and beans are a healthy choice for protein rather then red meat. A low sodium diet and drinking 64 ounces of water daily is generally recommended. Oils and sweet should be limited. Carbohydrates especially for those who are diabetic or overweight, should be limited to 30-45 gram per meal. It is important to eat on a regular schedule, at least 3 times daily. Snacks should be primarily fruits, vegetables or nuts.   PLEASE break the phentermine in  Half, weight loss goal of 3 to 5 pounds per month  Congrats on smoking cessation, pls keep it up  Flu vaccine today

## 2011-08-03 ENCOUNTER — Telehealth: Payer: Self-pay | Admitting: Family Medicine

## 2011-08-03 DIAGNOSIS — E669 Obesity, unspecified: Secondary | ICD-10-CM

## 2011-08-03 MED ORDER — PHENTERMINE HCL 37.5 MG PO TABS: 37.5000 mg | ORAL_TABLET | Freq: Every day | ORAL | Status: DC

## 2011-08-03 NOTE — Assessment & Plan Note (Signed)
Controlled, no change in medication  

## 2011-08-03 NOTE — Telephone Encounter (Signed)
Sent to [facility] [address]

## 2011-08-03 NOTE — Assessment & Plan Note (Signed)
Deteriorated. Patient re-educated about  the importance of commitment to a  minimum of 150 minutes of exercise per week. The importance of healthy food choices with portion control discussed. Encouraged to start a food diary, count calories and to consider  joining a support group. Sample diet sheets offered. Goals set by the patient for the next several months.    

## 2011-08-03 NOTE — Assessment & Plan Note (Signed)
Elevated LDL, low fat diet discussed and encouraged, no change in medication at this time

## 2011-08-03 NOTE — Assessment & Plan Note (Signed)
Improved , pt will not be restated on medication at this time

## 2011-08-06 ENCOUNTER — Ambulatory Visit (HOSPITAL_COMMUNITY): Payer: BC Managed Care – PPO

## 2011-11-05 ENCOUNTER — Ambulatory Visit (HOSPITAL_COMMUNITY)
Admission: RE | Admit: 2011-11-05 | Discharge: 2011-11-05 | Disposition: A | Discharge status: Home / Self Care | Payer: BC Managed Care – PPO | Source: Ambulatory Visit | Attending: Family Medicine | Admitting: Family Medicine

## 2011-11-05 DIAGNOSIS — Z1231 Encounter for screening mammogram for malignant neoplasm of breast: Secondary | ICD-10-CM | POA: Insufficient documentation

## 2011-11-05 DIAGNOSIS — Z139 Encounter for screening, unspecified: Secondary | ICD-10-CM

## 2011-11-23 ENCOUNTER — Ambulatory Visit (INDEPENDENT_AMBULATORY_CARE_PROVIDER_SITE_OTHER): Payer: BC Managed Care – PPO | Admitting: Family Medicine

## 2011-11-23 ENCOUNTER — Encounter: Payer: Self-pay | Admitting: Family Medicine

## 2011-11-23 VITALS — BP 120/82 | HR 95 | Resp 15 | Ht 64.0 in | Wt 193.0 lb

## 2011-11-23 DIAGNOSIS — E785 Hyperlipidemia, unspecified: Secondary | ICD-10-CM

## 2011-11-23 DIAGNOSIS — E669 Obesity, unspecified: Secondary | ICD-10-CM

## 2011-11-23 DIAGNOSIS — R7302 Impaired glucose tolerance (oral): Secondary | ICD-10-CM

## 2011-11-23 DIAGNOSIS — R7309 Other abnormal glucose: Secondary | ICD-10-CM

## 2011-11-23 DIAGNOSIS — R7301 Impaired fasting glucose: Secondary | ICD-10-CM

## 2011-11-23 DIAGNOSIS — I10 Essential (primary) hypertension: Secondary | ICD-10-CM

## 2011-11-23 LAB — CBC WITH DIFFERENTIAL/PLATELET
Basophils Absolute: 0 K/uL (ref 0.0–0.1)
Basophils Relative: 0 % (ref 0–1)
Eosinophils Absolute: 0.2 K/uL (ref 0.0–0.7)
Eosinophils Relative: 3 % (ref 0–5)
HCT: 41.2 % (ref 36.0–46.0)
Hemoglobin: 13 g/dL (ref 12.0–15.0)
Lymphocytes Relative: 36 % (ref 12–46)
Lymphs Abs: 2.8 K/uL (ref 0.7–4.0)
MCH: 29.3 pg (ref 26.0–34.0)
MCHC: 31.6 g/dL (ref 30.0–36.0)
MCV: 92.8 fL (ref 78.0–100.0)
Monocytes Absolute: 0.6 K/uL (ref 0.1–1.0)
Monocytes Relative: 7 % (ref 3–12)
Neutro Abs: 4.2 K/uL (ref 1.7–7.7)
Neutrophils Relative %: 54 % (ref 43–77)
Platelets: 338 K/uL (ref 150–400)
RBC: 4.44 MIL/uL (ref 3.87–5.11)
RDW: 12.9 % (ref 11.5–15.5)
WBC: 7.8 K/uL (ref 4.0–10.5)

## 2011-11-23 LAB — HEMOGLOBIN A1C
Hgb A1c MFr Bld: 6.4 % — ABNORMAL HIGH
Mean Plasma Glucose: 137 mg/dL — ABNORMAL HIGH

## 2011-11-23 LAB — TSH: TSH: 0.812 u[IU]/mL (ref 0.350–4.500)

## 2011-11-23 MED ORDER — PHENTERMINE HCL 37.5 MG PO TABS: 37.5000 mg | ORAL_TABLET | Freq: Every day | ORAL | Status: DC

## 2011-11-23 MED ORDER — LOSARTAN POTASSIUM-HCTZ 100-25 MG PO TABS: 1.0000 | ORAL_TABLET | Freq: Every day | ORAL | Status: DC

## 2011-11-23 NOTE — Patient Instructions (Addendum)
F/u in 4 month  Return in 2 weeks for ear irrigation, use a softener before you come  You need to follow a low carb diet, you will get information about this.  You are on the verge of becoming diabetic, you need to limit carb intake and commit to regular access  A healthy diet is rich in fruit, vegetables and whole grains. Poultry fish, nuts and beans are a healthy choice for protein rather then red meat. A low sodium diet and drinking 64 ounces of water daily is generally recommended. Oils and sweet should be limited. Carbohydrates especially for those who are diabetic or overweight, should be limited to 30-45 gram per meal. It is important to eat on a regular schedule, at least 3 times daily. Snacks should be primarily fruits, vegetables or nuts.  It is important that you exercise regularly at least 30 minutes 5 times a week. If you develop chest pain, have severe difficulty breathing, or feel very tired, stop exercising immediately and seek medical attention    You need to stop smoking  Fasting lipid , cmp and hBA1C in 4 month

## 2011-12-05 DIAGNOSIS — R7302 Impaired glucose tolerance (oral): Secondary | ICD-10-CM | POA: Insufficient documentation

## 2011-12-05 NOTE — Assessment & Plan Note (Signed)
HBA1C is 6.4, explained the significance of this and strongly advised pt to attend diabetic education class. Material was also provided

## 2011-12-05 NOTE — Assessment & Plan Note (Signed)
Improved. Pt applauded on succesful weight loss through lifestyle change, and encouraged to continue same. Weight loss goal set for the next several months.  

## 2011-12-05 NOTE — Progress Notes (Signed)
  Subjective:    Patient ID: Amanda Simon, female    DOB: 08/23/1951, 61 y.o.   MRN: [id number]  HPI The PT is here for follow up and re-evaluation of chronic medical conditions, medication management and review of any available recent lab and radiology data.  Preventive health is updated, specifically  Cancer screening and Immunization.   Questions or concerns regarding consultations or procedures which the PT has had in the interim are  addressed. The PT denies any adverse reactions to current medications since the last visit.  There are no new concerns.  There are no specific complaints . Feels well, is happy with weight loss since last visit      Review of Systems See HPI Denies recent fever or chills. Denies sinus pressure, nasal congestion, ear pain or sore throat. Denies chest congestion, productive cough or wheezing. Denies chest pains, palpitations and leg swelling Denies abdominal pain, nausea, vomiting,diarrhea or constipation.   Denies dysuria, frequency, hesitancy or incontinence. Denies joint pain, swelling and limitation in mobility. Denies headaches, seizures, numbness, or tingling. Denies depression, anxiety or insomnia. Denies skin break down or rash.        Objective:   Physical Exam Patient alert and oriented and in no cardiopulmonary distress.  HEENT: No facial asymmetry, EOMI, no sinus tenderness,  oropharynx pink and moist.  Neck supple no adenopathy.Bilateral cerumen impaction  Chest: Clear to auscultation bilaterally.Decreased throughout  CVS: S1, S2 no murmurs, no S3.  ABD: Soft non tender. Bowel sounds normal.  Ext: No edema  MS: Adequate ROM spine, shoulders, hips and knees.  Skin: Intact, no ulcerations or rash noted.  Psych: Good eye contact, normal affect. Memory intact not anxious or depressed appearing.  CNS: CN 2-12 intact, power, tone and sensation normal throughout.        Assessment & Plan:

## 2011-12-05 NOTE — Assessment & Plan Note (Signed)
Deteriorated, LDL elevated, low fat diet counseling done, no med change

## 2011-12-05 NOTE — Assessment & Plan Note (Signed)
Controlled, no change in medication  

## 2012-01-05 ENCOUNTER — Other Ambulatory Visit: Payer: Self-pay

## 2012-01-05 ENCOUNTER — Telehealth: Payer: Self-pay | Admitting: Family Medicine

## 2012-01-05 MED ORDER — PHENTERMINE HCL 37.5 MG PO TABS: 37.5000 mg | ORAL_TABLET | Freq: Every day | ORAL | Status: DC

## 2012-01-05 NOTE — Telephone Encounter (Signed)
pls send in 30 tabs of phentermine, adviose her to break in half, and she needs iov in 2 months

## 2012-01-06 NOTE — Telephone Encounter (Signed)
Pt aware and stated that she has an appt scheduled in June or July.

## 2012-01-10 ENCOUNTER — Telehealth: Payer: Self-pay | Admitting: Family Medicine

## 2012-01-11 NOTE — Telephone Encounter (Signed)
I am not aware that this is sufficient justification for permanent excuse, have checked , and will be unable to write this

## 2012-01-11 NOTE — Telephone Encounter (Signed)
Needs letter concerning being excused from jury duty permanantly since she is on fluid pill and has to go to the bathroom all the time. Will come collect tomorrow

## 2012-01-12 NOTE — Telephone Encounter (Signed)
I explained to her that taking a fluid pill was not enough of a reason to be excused from jury duty and she wants you to know she has already been on the jury this week and she has barely been making it to the bathroom and it just hits her all of a sudden and she has to go right then. Wants to see if there is anyway she can get excused. Told her I didn't think so but i would send the message

## 2012-01-12 NOTE — Telephone Encounter (Signed)
This is a new problem, may have a uti, will need to ber checked. If incontinence is the issue will need to be diosabling to get excused, much more extensive medical eval needed including probably seeing a urologist. Right now I have insufficient medical reason to provide the excuse

## 2012-01-16 ENCOUNTER — Other Ambulatory Visit: Payer: Self-pay | Admitting: Family Medicine

## 2012-02-25 ENCOUNTER — Telehealth: Payer: Self-pay | Admitting: Family Medicine

## 2012-02-25 ENCOUNTER — Other Ambulatory Visit: Payer: Self-pay

## 2012-02-25 MED ORDER — PHENTERMINE HCL 37.5 MG PO TABS: 37.5000 mg | ORAL_TABLET | Freq: Every day | ORAL | Status: DC

## 2012-02-25 NOTE — Telephone Encounter (Signed)
Med sent.

## 2012-03-28 ENCOUNTER — Ambulatory Visit (INDEPENDENT_AMBULATORY_CARE_PROVIDER_SITE_OTHER): Payer: BC Managed Care – PPO | Admitting: Family Medicine

## 2012-03-28 ENCOUNTER — Encounter: Payer: Self-pay | Admitting: Family Medicine

## 2012-03-28 ENCOUNTER — Other Ambulatory Visit: Payer: Self-pay

## 2012-03-28 VITALS — BP 124/76 | HR 97 | Resp 18 | Ht 64.0 in | Wt 174.0 lb

## 2012-03-28 DIAGNOSIS — F17219 Nicotine dependence, cigarettes, with unspecified nicotine-induced disorders: Secondary | ICD-10-CM | POA: Insufficient documentation

## 2012-03-28 DIAGNOSIS — Z720 Tobacco use: Secondary | ICD-10-CM

## 2012-03-28 DIAGNOSIS — E669 Obesity, unspecified: Secondary | ICD-10-CM

## 2012-03-28 DIAGNOSIS — R7302 Impaired glucose tolerance (oral): Secondary | ICD-10-CM

## 2012-03-28 DIAGNOSIS — R7309 Other abnormal glucose: Secondary | ICD-10-CM

## 2012-03-28 DIAGNOSIS — E785 Hyperlipidemia, unspecified: Secondary | ICD-10-CM

## 2012-03-28 DIAGNOSIS — F17200 Nicotine dependence, unspecified, uncomplicated: Secondary | ICD-10-CM

## 2012-03-28 DIAGNOSIS — I10 Essential (primary) hypertension: Secondary | ICD-10-CM

## 2012-03-28 MED ORDER — PHENTERMINE HCL 37.5 MG PO TABS: 37.5000 mg | ORAL_TABLET | Freq: Every day | ORAL | Status: DC

## 2012-03-28 MED ORDER — SIMVASTATIN 40 MG PO TABS: 40.0000 mg | ORAL_TABLET | Freq: Every day | ORAL | Status: DC

## 2012-03-28 MED ORDER — LOSARTAN POTASSIUM-HCTZ 100-25 MG PO TABS: 1.0000 | ORAL_TABLET | Freq: Every day | ORAL | Status: DC

## 2012-03-28 NOTE — Assessment & Plan Note (Signed)
Low carb diet encouraged, hBA1c today

## 2012-03-28 NOTE — Assessment & Plan Note (Signed)
Controlled, no change in medication  

## 2012-03-28 NOTE — Assessment & Plan Note (Signed)
Hyperlipidemia:Low fat diet discussed and encouraged.  Updated lab today 

## 2012-03-28 NOTE — Patient Instructions (Addendum)
Annual exam in 4 month  Fasting lipid cmp and hBA1C today  Call the [facility] doc about when your colonoscopy is due and schedule please as discussed  You are referred for a chest cT scan and you need to QUIT smoking   Please think about quitting smoking.  This is very important for your health.  Consider setting a quit date, then cutting back or switching brands to prepare to stop.  Also think of the money you will save every day by not smoking.  Quick Tips to Quit Smoking: Fix a date i.e. keep a date in mind from when you would not touch a tobacco product to smoke  Keep yourself busy and block your mind with work loads or reading books or watching movies in malls where smoking is not allowed  Vanish off the things which reminds you about smoking for example match box, or your favorite lighter, or the pipe you used for smoking, or your favorite jeans and shirt with which you used to enjoy smoking, or the club where you used to do smoking  Try to avoid certain people places and incidences where and with whom smoking is a common factor to add on  Praise yourself with some token gifts from the money you saved by stopping smoking  Anti Smoking teams are there to help you. Join their programs  Anti-smoking Gums are there in many medical shops. Try them to quit smoking   Side-effects of Smoking: Disease caused by smoking cigarettes are emphysema, bronchitis, heart failures  Premature death  Cancer is the major side effect of smoking  Heart attacks and strokes are the quick effects of smoking causing sudden death  Some smokers lives end up with limbs amputated  Breathing problem or fast breathing is another side effect of smoking  Due to more intakes of smokes, carbon mono-oxide goes into your brain and other muscles of the body which leads to swelling of the veins and blockage to the air passage to lungs  Carbon monoxide blocks blood vessels which leads to blockage in the flow of blood to different  major body organs like heart lungs and thus leads to attacks and deaths  During pregnancy smoking is very harmful and leads to premature birth of the infant, spontaneous abortions, low weight of the infant during birth  Fat depositions to narrow and blocked blood vessels causing heart attacks  In many cases cigarette smoking caused infertility in men

## 2012-03-28 NOTE — Addendum Note (Signed)
Addended by: Kandis Fantasia B on: 03/28/2012 03:59 PM   Modules accepted: Orders, Medications

## 2012-03-28 NOTE — Progress Notes (Signed)
  Subjective:    Patient ID: Amanda Simon, female    DOB: 1951-01-16, 61 y.o.   MRN: [id number]  HPI The PT is here for follow up and re-evaluation of chronic medical conditions, medication management and review of any available recent lab and radiology data.  Preventive health is updated, specifically  Cancer screening and Immunization.   Questions or concerns regarding consultations or procedures which the PT has had in the interim are  addressed. The PT denies any adverse reactions to current medications since the last visit.  There are no new concerns. Happy with weight loss There are no specific complaints       Review of Systems See HPI Denies recent fever or chills. Denies sinus pressure, nasal congestion, ear pain or sore throat. Denies chest congestion, productive cough or wheezing. Denies chest pains, palpitations and leg swelling Denies abdominal pain, nausea, vomiting,diarrhea or constipation.   Denies dysuria, frequency, hesitancy or incontinence. Denies joint pain, swelling and limitation in mobility. Denies headaches, seizures, numbness, or tingling. Denies depression, anxiety or insomnia. Denies skin break down or rash.        Objective:   Physical Exam  Patient alert and oriented and in no cardiopulmonary distress.  HEENT: No facial asymmetry, EOMI, no sinus tenderness,  oropharynx pink and moist.  Neck supple no adenopathy.  Chest: Clear to auscultation bilaterally.decreased air entry throughout  CVS: S1, S2 no murmurs, no S3.  ABD: Soft non tender. Bowel sounds normal.  Ext: No edema  MS: Adequate ROM spine, shoulders, hips and knees.  Skin: Intact, no ulcerations or rash noted.  Psych: Good eye contact, normal affect. Memory intact not anxious or depressed appearing.  CNS: CN 2-12 intact, power, tone and sensation normal throughout.       Assessment & Plan:

## 2012-03-28 NOTE — Assessment & Plan Note (Signed)
Started in 1974, mother died of lung cancer, counseled on cessation for 5 minutes.  neeeds chest ct

## 2012-03-28 NOTE — Assessment & Plan Note (Signed)
Improved. Pt applauded on succesful weight loss through lifestyle change, and encouraged to continue same. Weight loss goal set for the next several months.  

## 2012-03-28 NOTE — Addendum Note (Signed)
Addended by: Kandis Fantasia B on: 03/28/2012 11:43 AM   Modules accepted: Orders

## 2012-03-29 ENCOUNTER — Other Ambulatory Visit: Payer: Self-pay | Admitting: Family Medicine

## 2012-03-29 LAB — COMPREHENSIVE METABOLIC PANEL
Alkaline Phosphatase: 97 U/L (ref 39–117)
Creat: 0.67 mg/dL (ref 0.50–1.10)
Glucose, Bld: 84 mg/dL (ref 70–99)
Sodium: 140 mEq/L (ref 135–145)
Total Bilirubin: 0.8 mg/dL (ref 0.3–1.2)
Total Protein: 7.6 g/dL (ref 6.0–8.3)

## 2012-03-29 LAB — LIPID PANEL
LDL Cholesterol: 106 mg/dL — ABNORMAL HIGH (ref 0–99)
Total CHOL/HDL Ratio: 3.2 Ratio
Triglycerides: 71 mg/dL (ref <150)
VLDL: 14 mg/dL (ref 0–40)

## 2012-04-05 ENCOUNTER — Ambulatory Visit (HOSPITAL_COMMUNITY): Payer: BC Managed Care – PPO

## 2012-07-27 ENCOUNTER — Telehealth: Payer: Self-pay | Admitting: Family Medicine

## 2012-07-27 DIAGNOSIS — E669 Obesity, unspecified: Secondary | ICD-10-CM

## 2012-07-27 DIAGNOSIS — E785 Hyperlipidemia, unspecified: Secondary | ICD-10-CM

## 2012-07-27 DIAGNOSIS — I10 Essential (primary) hypertension: Secondary | ICD-10-CM

## 2012-07-27 DIAGNOSIS — R7302 Impaired glucose tolerance (oral): Secondary | ICD-10-CM

## 2012-07-27 NOTE — Telephone Encounter (Signed)
Labs given

## 2012-07-28 LAB — COMPLETE METABOLIC PANEL WITH GFR
Albumin: 4.1 g/dL (ref 3.5–5.2)
BUN: 12 mg/dL (ref 6–23)
CO2: 31 mEq/L (ref 19–32)
GFR, Est African American: >89 mL/min
GFR, Est Non African American: >89 mL/min
Glucose, Bld: 82 mg/dL (ref 70–99)
Potassium: 4.2 mEq/L (ref 3.5–5.3)
Sodium: 137 mEq/L (ref 135–145)
Total Protein: 7.3 g/dL (ref 6.0–8.3)

## 2012-07-28 LAB — HEMOGLOBIN A1C
Hgb A1c MFr Bld: 6.2 % — ABNORMAL HIGH (ref <5.7)
Mean Plasma Glucose: 131 mg/dL — ABNORMAL HIGH (ref <117)

## 2012-07-28 LAB — LIPID PANEL: Cholesterol: 176 mg/dL (ref 0–200)

## 2012-08-22 ENCOUNTER — Other Ambulatory Visit (HOSPITAL_COMMUNITY)
Admission: RE | Admit: 2012-08-22 | Discharge: 2012-08-22 | Disposition: A | Discharge status: Home / Self Care | Payer: BC Managed Care – PPO | Source: Ambulatory Visit | Attending: Family Medicine | Admitting: Family Medicine

## 2012-08-22 ENCOUNTER — Ambulatory Visit (HOSPITAL_COMMUNITY)
Admission: RE | Admit: 2012-08-22 | Discharge: 2012-08-22 | Disposition: A | Discharge status: Home / Self Care | Payer: BC Managed Care – PPO | Source: Ambulatory Visit | Attending: Family Medicine | Admitting: Family Medicine

## 2012-08-22 ENCOUNTER — Ambulatory Visit (INDEPENDENT_AMBULATORY_CARE_PROVIDER_SITE_OTHER): Payer: BC Managed Care – PPO | Admitting: Family Medicine

## 2012-08-22 ENCOUNTER — Encounter: Payer: Self-pay | Admitting: Family Medicine

## 2012-08-22 VITALS — BP 138/82 | HR 87 | Resp 15 | Ht 64.0 in | Wt 183.0 lb

## 2012-08-22 DIAGNOSIS — Z720 Tobacco use: Secondary | ICD-10-CM

## 2012-08-22 DIAGNOSIS — Z1151 Encounter for screening for human papillomavirus (HPV): Secondary | ICD-10-CM | POA: Insufficient documentation

## 2012-08-22 DIAGNOSIS — M25519 Pain in unspecified shoulder: Secondary | ICD-10-CM | POA: Insufficient documentation

## 2012-08-22 DIAGNOSIS — Z124 Encounter for screening for malignant neoplasm of cervix: Secondary | ICD-10-CM

## 2012-08-22 DIAGNOSIS — M25511 Pain in right shoulder: Secondary | ICD-10-CM

## 2012-08-22 DIAGNOSIS — Z01419 Encounter for gynecological examination (general) (routine) without abnormal findings: Secondary | ICD-10-CM | POA: Insufficient documentation

## 2012-08-22 DIAGNOSIS — F17200 Nicotine dependence, unspecified, uncomplicated: Secondary | ICD-10-CM

## 2012-08-22 DIAGNOSIS — E669 Obesity, unspecified: Secondary | ICD-10-CM

## 2012-08-22 DIAGNOSIS — R7309 Other abnormal glucose: Secondary | ICD-10-CM

## 2012-08-22 DIAGNOSIS — I10 Essential (primary) hypertension: Secondary | ICD-10-CM

## 2012-08-22 DIAGNOSIS — Z1211 Encounter for screening for malignant neoplasm of colon: Secondary | ICD-10-CM

## 2012-08-22 DIAGNOSIS — F329 Major depressive disorder, single episode, unspecified: Secondary | ICD-10-CM

## 2012-08-22 DIAGNOSIS — R7302 Impaired glucose tolerance (oral): Secondary | ICD-10-CM

## 2012-08-22 DIAGNOSIS — Z0000 Encounter for general adult medical examination without abnormal findings: Secondary | ICD-10-CM

## 2012-08-22 LAB — POC HEMOCCULT BLD/STL (OFFICE/1-CARD/DIAGNOSTIC): Fecal Occult Blood, POC: NEGATIVE

## 2012-08-22 MED ORDER — PREDNISONE (PAK) 5 MG PO TABS: 5.0000 mg | ORAL_TABLET | ORAL | Status: DC

## 2012-08-22 MED ORDER — IBUPROFEN 800 MG PO TABS: 800.0000 mg | ORAL_TABLET | Freq: Three times a day (TID) | ORAL | Status: DC | PRN

## 2012-08-22 MED ORDER — LOSARTAN POTASSIUM-HCTZ 100-25 MG PO TABS: 1.0000 | ORAL_TABLET | Freq: Every day | ORAL | Status: DC

## 2012-08-22 MED ORDER — BUPROPION HCL 100 MG PO TABS: 100.0000 mg | ORAL_TABLET | Freq: Two times a day (BID) | ORAL | Status: DC

## 2012-08-22 MED ORDER — PHENTERMINE HCL 37.5 MG PO TABS: 37.5000 mg | ORAL_TABLET | Freq: Every day | ORAL | Status: DC

## 2012-08-22 MED ORDER — BUPROPION HCL ER (SR) 150 MG PO TB12: 150.0000 mg | ORAL_TABLET | Freq: Two times a day (BID) | ORAL | Status: DC

## 2012-08-22 NOTE — Assessment & Plan Note (Signed)
Recent h/o non aggravated pain, xray and short term ant inflammatories. Pt to call back if not better for ortho eval

## 2012-08-22 NOTE — Addendum Note (Signed)
Addended by: Abner Greenspan on: 08/22/2012 02:04 PM   Modules accepted: Orders

## 2012-08-22 NOTE — Assessment & Plan Note (Addendum)
Deteriorated, pt to resume welbutrin, has been under increased stress Patient counseled for approximately 5 minutes regarding the health risks of ongoing nicotine use, specifically all types of cancer, heart disease, stroke and respiratory failure. The options available for help with cessation ,the behavioral changes to assist the process, and the option to either gradully reduce usage  Or abruptly stop.is also discussed. Pt is also encouraged to set specific goals in number of cigarettes used daily, as well as to set a quit date.

## 2012-08-22 NOTE — Patient Instructions (Addendum)
F/U in 4 month  Please get an xray of your shoulder today, and medication is sent in for the pain in your shoulder.Call if you want referral to orthopedics Please check about the chest scan to reschedule on your way out.  You need to quit smoking to reduce your risk of cancer, heart disease and stroke. Please use the wellbutrin to help with this.  Please work on improved control of sweets and carbs so that you lose weight , also phentermine half daily is prescribed.  You have been through a lot, please keep the faith   Fastinvg cbc, HBa1C and chem 7 in 4 month  Please get the flu vaccine at your pharmacy

## 2012-08-22 NOTE — Assessment & Plan Note (Signed)
Annual exam done, and pap sent. New concerns as documented. Smoking cessation counseling done and pt to resume welbutrin Pt to recommit to improved diet and exercise to facilitate weight loss, she will also resume phentermine

## 2012-08-22 NOTE — Assessment & Plan Note (Signed)
Increased in the past 5 months, daughter with cirrhosis, long h/o street drug use, improving now, but pt feels the need to resume anti depressant at this time. Not suicidal or homicidal

## 2012-08-22 NOTE — Progress Notes (Signed)
  Subjective:    Patient ID: Amanda Simon, female    DOB: 06/21/1951, 61 y.o.   MRN: [id number]  HPI The PT is here for annual exam and re-evaluation of chronic medical conditions, medication management and review of any available recent lab and radiology data.  Preventive health is updated, specifically  Cancer screening and Immunization. Still needs colonoscopy and flu vaccine, personal h/o colon polyps   The PT denies any adverse reactions to current medications since the last visit. Wants to resume phentermine, she has gained weight. Has also been under increased stress due ill health of her daughter , requests resumption of wellbutrin for this. Smoking has also increased, wants to quit, unwilling to set a date at this time. 2 to 3 month h/o right shoulder pain worse at night with direct pressure    Review of Systems See HPI Denies recent fever or chills. Denies sinus pressure, nasal congestion, ear pain or sore throat. Denies chest congestion, productive cough or wheezing. Denies chest pains, palpitations and leg swelling Denies abdominal pain, nausea, vomiting,diarrhea or constipation.   Denies dysuria, frequency, hesitancy or incontinence.  Denies headaches, seizures, numbness, or tingling. . Denies skin break down or rash.        Objective:   Physical Exam Pleasant well nourished female, alert and oriented x 3, in no cardio-pulmonary distress. Afebrile. HEENT No facial trauma or asymetry. Sinuses non tender.  EOMI, PERTL, fundoscopic exam is normal, no hemorhage or exudate.  External ears normal, tympanic membranes clear. Oropharynx moist, no exudate, good dentition. Neck: supple, no adenopathy,JVD or thyromegaly.No bruits.  Chest: Clear to ascultation bilaterally.No crackles or wheezes. Non tender to palpation  Breast: No asymetry,no masses. No nipple discharge or inversion. No axillary or supraclavicular adenopathy  Cardiovascular system; Heart sounds  normal,  S1 and  S2 ,no S3.  No murmur, or thrill. Apical beat not displaced Peripheral pulses normal.  Abdomen: Soft, non tender, no organomegaly or masses. No bruits. Bowel sounds normal. No guarding, tenderness or rebound.  Rectal:  No mass. Guaiac negative stool.  GU: External genitalia normal. No lesions. Vaginal canal normal.Physiologic  discharge. Uterus absent, no adnexal masses, no  adnexal tenderness.  Musculoskeletal exam: Full ROM of spine, hips , and knees.Mildly reduced ROM right shoulder No deformity ,swelling or crepitus noted. No muscle wasting or atrophy.   Neurologic: Cranial nerves 2 to 12 intact. Power, tone ,sensation and reflexes normal throughout. No disturbance in gait. No tremor.  Skin: Intact, no ulceration, erythema , scaling or rash noted. Pigmentation normal throughout  Psych; Normal mood and affect. Judgement and concentration normal       Assessment & Plan:

## 2012-08-28 ENCOUNTER — Other Ambulatory Visit: Payer: Self-pay | Admitting: Family Medicine

## 2012-08-28 DIAGNOSIS — A599 Trichomoniasis, unspecified: Secondary | ICD-10-CM

## 2012-08-30 ENCOUNTER — Other Ambulatory Visit: Payer: Self-pay

## 2012-08-30 DIAGNOSIS — A599 Trichomoniasis, unspecified: Secondary | ICD-10-CM

## 2012-08-30 MED ORDER — METRONIDAZOLE 500 MG PO TABS: ORAL_TABLET | ORAL | Status: DC

## 2012-09-08 ENCOUNTER — Other Ambulatory Visit: Payer: Self-pay | Admitting: Family Medicine

## 2012-09-20 DIAGNOSIS — R7302 Impaired glucose tolerance (oral): Secondary | ICD-10-CM

## 2012-09-20 HISTORY — DX: Impaired glucose tolerance (oral): R73.02

## 2012-09-28 ENCOUNTER — Telehealth: Payer: Self-pay | Admitting: Family Medicine

## 2012-09-28 NOTE — Telephone Encounter (Signed)
Patient aware that the 4 pills is the standard of treatment and she is no longer having symptoms

## 2012-10-16 ENCOUNTER — Telehealth: Payer: Self-pay | Admitting: Family Medicine

## 2012-10-16 LAB — BASIC METABOLIC PANEL
Calcium: 9.2 mg/dL (ref 8.4–10.5)
Creat: 0.62 mg/dL (ref 0.50–1.10)
Sodium: 140 mEq/L (ref 135–145)

## 2012-10-16 LAB — CBC WITH DIFFERENTIAL/PLATELET
Basophils Absolute: 0 10*3/uL (ref 0.0–0.1)
Basophils Relative: 0 % (ref 0–1)
Eosinophils Absolute: 0.2 10*3/uL (ref 0.0–0.7)
Eosinophils Relative: 3 % (ref 0–5)
Lymphs Abs: 2.4 10*3/uL (ref 0.7–4.0)
MCH: 28.6 pg (ref 26.0–34.0)
MCHC: 33.1 g/dL (ref 30.0–36.0)
Neutrophils Relative %: 56 % (ref 43–77)
Platelets: 296 10*3/uL (ref 150–400)
RBC: 4.23 MIL/uL (ref 3.87–5.11)
RDW: 13.1 % (ref 11.5–15.5)

## 2012-10-16 LAB — HEMOGLOBIN A1C
Hgb A1c MFr Bld: 6.4 % — ABNORMAL HIGH (ref <5.7)
Mean Plasma Glucose: 137 mg/dL — ABNORMAL HIGH (ref <117)

## 2012-10-16 NOTE — Telephone Encounter (Signed)
labwork faxed to lab

## 2012-10-23 ENCOUNTER — Telehealth: Payer: Self-pay | Admitting: Family Medicine

## 2012-10-23 NOTE — Telephone Encounter (Signed)
Patient aware of recent results.

## 2012-10-25 ENCOUNTER — Ambulatory Visit: Payer: BC Managed Care – PPO | Admitting: Family Medicine

## 2012-12-12 ENCOUNTER — Other Ambulatory Visit: Payer: Self-pay

## 2012-12-12 ENCOUNTER — Telehealth: Payer: Self-pay | Admitting: Family Medicine

## 2012-12-12 ENCOUNTER — Other Ambulatory Visit: Payer: Self-pay | Admitting: Family Medicine

## 2012-12-12 MED ORDER — PHENTERMINE HCL 37.5 MG PO TABS: 37.5000 mg | ORAL_TABLET | Freq: Every day | ORAL | Status: DC

## 2012-12-26 ENCOUNTER — Ambulatory Visit: Payer: BC Managed Care – PPO | Admitting: Family Medicine

## 2013-01-04 ENCOUNTER — Ambulatory Visit: Payer: BC Managed Care – PPO | Admitting: Family Medicine

## 2013-01-24 ENCOUNTER — Ambulatory Visit (INDEPENDENT_AMBULATORY_CARE_PROVIDER_SITE_OTHER): Payer: BC Managed Care – PPO | Admitting: Family Medicine

## 2013-01-24 ENCOUNTER — Other Ambulatory Visit (HOSPITAL_COMMUNITY)
Admission: RE | Admit: 2013-01-24 | Discharge: 2013-01-24 | Disposition: A | Discharge status: Home / Self Care | Payer: BC Managed Care – PPO | Source: Ambulatory Visit | Attending: Family Medicine | Admitting: Family Medicine

## 2013-01-24 ENCOUNTER — Encounter: Payer: Self-pay | Admitting: Family Medicine

## 2013-01-24 VITALS — BP 114/78 | HR 84 | Resp 16 | Ht 64.0 in | Wt 186.1 lb

## 2013-01-24 DIAGNOSIS — I10 Essential (primary) hypertension: Secondary | ICD-10-CM

## 2013-01-24 DIAGNOSIS — R5383 Other fatigue: Secondary | ICD-10-CM

## 2013-01-24 DIAGNOSIS — R5381 Other malaise: Secondary | ICD-10-CM

## 2013-01-24 DIAGNOSIS — E669 Obesity, unspecified: Secondary | ICD-10-CM

## 2013-01-24 DIAGNOSIS — F3289 Other specified depressive episodes: Secondary | ICD-10-CM

## 2013-01-24 DIAGNOSIS — N760 Acute vaginitis: Secondary | ICD-10-CM

## 2013-01-24 DIAGNOSIS — R7309 Other abnormal glucose: Secondary | ICD-10-CM

## 2013-01-24 DIAGNOSIS — E785 Hyperlipidemia, unspecified: Secondary | ICD-10-CM

## 2013-01-24 DIAGNOSIS — F329 Major depressive disorder, single episode, unspecified: Secondary | ICD-10-CM

## 2013-01-24 DIAGNOSIS — R7302 Impaired glucose tolerance (oral): Secondary | ICD-10-CM

## 2013-01-24 DIAGNOSIS — F17200 Nicotine dependence, unspecified, uncomplicated: Secondary | ICD-10-CM

## 2013-01-24 LAB — COMPREHENSIVE METABOLIC PANEL
CO2: 30 mEq/L (ref 19–32)
Calcium: 9.9 mg/dL (ref 8.4–10.5)
Chloride: 101 mEq/L (ref 96–112)
Creat: 0.76 mg/dL (ref 0.50–1.10)
Glucose, Bld: 104 mg/dL — ABNORMAL HIGH (ref 70–99)
Sodium: 138 mEq/L (ref 135–145)
Total Bilirubin: 0.7 mg/dL (ref 0.3–1.2)
Total Protein: 7.9 g/dL (ref 6.0–8.3)

## 2013-01-24 LAB — HEMOGLOBIN A1C
Hgb A1c MFr Bld: 5.9 % — ABNORMAL HIGH (ref <5.7)
Mean Plasma Glucose: 123 mg/dL — ABNORMAL HIGH (ref <117)

## 2013-01-24 LAB — LIPID PANEL
HDL: 56 mg/dL (ref >39)
Triglycerides: 75 mg/dL (ref <150)

## 2013-01-24 LAB — TSH: TSH: 1.415 u[IU]/mL (ref 0.350–4.500)

## 2013-01-24 MED ORDER — SIMVASTATIN 40 MG PO TABS: ORAL_TABLET | ORAL | Status: DC

## 2013-01-24 MED ORDER — PHENTERMINE HCL 37.5 MG PO TABS: 37.5000 mg | ORAL_TABLET | Freq: Every day | ORAL | Status: DC

## 2013-01-24 NOTE — Progress Notes (Signed)
  Subjective:    Patient ID: Amanda Simon, female    DOB: [birth year], 62 y.o.   MRN: [id number]  HPI The PT is here for follow up and re-evaluation of chronic medical conditions, medication management and review of any available recent lab and radiology data.  Preventive health is updated, specifically  Cancer screening and Immunization.    The PT denies any adverse reactions to current medications since the last visit.  C/o smelly vaginal d/c for the past 2 weeks, will self collect     Review of Systems See HPI Denies recent fever or chills. Denies sinus pressure, nasal congestion, ear pain or sore throat. Denies chest congestion, productive cough or wheezing. Denies chest pains, palpitations and leg swelling Denies abdominal pain, nausea, vomiting,diarrhea or constipation.   Denies dysuria, frequency, hesitancy or incontinence. Denies joint pain, swelling and limitation in mobility. Denies headaches, seizures, numbness, or tingling. Denies uncontrolled depression, anxiety or insomnia. Denies skin break down or rash.        Objective:   Physical Exam Patient alert and oriented and in no cardiopulmonary distress.  HEENT: No facial asymmetry, EOMI, no sinus tenderness,  oropharynx pink and moist.  Neck supple no adenopathy.  Chest: Clear to auscultation bilaterally.Decreased air entry throughout  CVS: S1, S2 no murmurs, no S3.  ABD: Soft non tender. Bowel sounds normal.  Ext: No edema  MS: Adequate ROM spine, shoulders, hips and knees.  Skin: Intact, no ulcerations or rash noted.  Psych: Good eye contact, normal affect. Memory intact not anxious or depressed appearing.  CNS: CN 2-12 intact, power, tone and sensation normal throughout.        Assessment & Plan:

## 2013-01-24 NOTE — Patient Instructions (Signed)
F/u in 4 month  Please quit smoking by Mother's day this year  We will send a swb for testing and contact you with the result.  Bilateral ear flush today.  Fasting lipid, cmp and TSH and HBA1C today  It is important that you exercise regularly at least 30 minutes 5 times a week. If you develop chest pain, have severe difficulty breathing, or feel very tired, stop exercising immediately and seek medical attention   A healthy diet is rich in fruit, vegetables and whole grains. Poultry fish, nuts and beans are a healthy choice for protein rather then red meat. A low sodium diet and drinking 64 ounces of water daily is generally recommended. Oils and sweet should be limited. Carbohydrates especially for those who are diabetic or overweight, should be limited to 34-45 gram per meal. It is important to eat on a regular schedule, at least 3 times daily. Snacks should be primarily fruits, vegetables or nuts.  Mammogram past due please schedule

## 2013-01-28 NOTE — Assessment & Plan Note (Signed)
Improved Patient educated about the importance of limiting  Carbohydrate intake , the need to commit to daily physical activity for a minimum of 30 minutes , and to commit weight loss. The fact that changes in all these areas will reduce or eliminate all together the development of diabetes is stressed.    

## 2013-01-28 NOTE — Assessment & Plan Note (Signed)
Deteriorated. Patient re-educated about  the importance of commitment to a  minimum of 150 minutes of exercise per week. The importance of healthy food choices with portion control discussed. Encouraged to start a food diary, count calories and to consider  joining a support group. Sample diet sheets offered. Goals set by the patient for the next several months.    

## 2013-01-28 NOTE — Assessment & Plan Note (Signed)
Controlled, no change in medication DASH diet and commitment to daily physical activity for a minimum of 30 minutes discussed and encouraged, as a part of hypertension management. The importance of attaining a healthy weight is also discussed.  

## 2013-01-28 NOTE — Assessment & Plan Note (Signed)
Decreased use but still using nicotine intermittently , espescially in stressful situations, unwilling to set a quit date Patient counseled for approximately 5 minutes regarding the health risks of ongoing nicotine use, specifically all types of cancer, heart disease, stroke and respiratory failure. The options available for help with cessation ,the behavioral changes to assist the process, and the option to either gradully reduce usage  Or abruptly stop.is also discussed. Pt is also encouraged to set specific goals in number of cigarettes used daily, as well as to set a quit date.

## 2013-01-28 NOTE — Assessment & Plan Note (Signed)
Deteriorated, LDL more elevated. No med change. Hyperlipidemia:Low fat diet discussed and encouraged.

## 2013-01-28 NOTE — Assessment & Plan Note (Signed)
Improved, as daughter's health has greatly improved

## 2013-03-21 NOTE — Telephone Encounter (Signed)
Refill @ 01/24/13 OV

## 2013-06-11 ENCOUNTER — Encounter: Payer: Self-pay | Admitting: Family Medicine

## 2013-06-11 ENCOUNTER — Ambulatory Visit (INDEPENDENT_AMBULATORY_CARE_PROVIDER_SITE_OTHER): Payer: BC Managed Care – PPO | Admitting: Family Medicine

## 2013-06-11 VITALS — BP 132/80 | HR 75 | Resp 16 | Wt 197.0 lb

## 2013-06-11 DIAGNOSIS — M25519 Pain in unspecified shoulder: Secondary | ICD-10-CM

## 2013-06-11 DIAGNOSIS — R7309 Other abnormal glucose: Secondary | ICD-10-CM

## 2013-06-11 DIAGNOSIS — F17200 Nicotine dependence, unspecified, uncomplicated: Secondary | ICD-10-CM

## 2013-06-11 DIAGNOSIS — Z13820 Encounter for screening for osteoporosis: Secondary | ICD-10-CM

## 2013-06-11 DIAGNOSIS — R7302 Impaired glucose tolerance (oral): Secondary | ICD-10-CM

## 2013-06-11 DIAGNOSIS — Z23 Encounter for immunization: Secondary | ICD-10-CM

## 2013-06-11 DIAGNOSIS — G8929 Other chronic pain: Secondary | ICD-10-CM | POA: Insufficient documentation

## 2013-06-11 DIAGNOSIS — I10 Essential (primary) hypertension: Secondary | ICD-10-CM

## 2013-06-11 DIAGNOSIS — E785 Hyperlipidemia, unspecified: Secondary | ICD-10-CM

## 2013-06-11 DIAGNOSIS — E669 Obesity, unspecified: Secondary | ICD-10-CM

## 2013-06-11 LAB — BASIC METABOLIC PANEL
Calcium: 9.5 mg/dL (ref 8.4–10.5)
Chloride: 103 mEq/L (ref 96–112)
Glucose, Bld: 86 mg/dL (ref 70–99)
Potassium: 3.8 mEq/L (ref 3.5–5.3)
Sodium: 140 mEq/L (ref 135–145)

## 2013-06-11 MED ORDER — PHENTERMINE HCL 37.5 MG PO CAPS: 37.5000 mg | ORAL_CAPSULE | ORAL | Status: DC

## 2013-06-11 MED ORDER — LOSARTAN POTASSIUM-HCTZ 100-25 MG PO TABS: 1.0000 | ORAL_TABLET | Freq: Every day | ORAL | Status: DC

## 2013-06-11 MED ORDER — METFORMIN HCL ER 500 MG PO TB24: 500.0000 mg | ORAL_TABLET | Freq: Every day | ORAL | Status: DC

## 2013-06-11 MED ORDER — IBUPROFEN 800 MG PO TABS: 800.0000 mg | ORAL_TABLET | Freq: Three times a day (TID) | ORAL | Status: DC | PRN

## 2013-06-11 MED ORDER — PREDNISONE 5 MG PO TABS: 5.0000 mg | ORAL_TABLET | Freq: Two times a day (BID) | ORAL | Status: AC

## 2013-06-11 MED ORDER — CYCLOBENZAPRINE HCL 10 MG PO TABS: ORAL_TABLET | ORAL | Status: AC

## 2013-06-11 NOTE — Patient Instructions (Addendum)
F/u in 4 month, call if you need me before  Flu vaccine today  HBA1C and chem 7  , and vit D  today  Fasting lipid cmp, hBA1C, CBC  in 4 month  Please schedule your mammogram  You are referred for bone density scan  It is important that you exercise regularly at least 30 minutes 5 times a week. If you develop chest pain, have severe difficulty breathing, or feel very tired, stop exercising immediately and seek medical attention   A healthy diet is rich in fruit, vegetables and whole grains. Poultry fish, nuts and beans are a healthy choice for protein rather then red meat. A low sodium diet and drinking 64 ounces of water daily is generally recommended. Oils and sweet should be limited. Carbohydrates especially for those who are diabetic or overweight, should be limited to 60-45 gram per meal. It is important to eat on a regular schedule, at least 3 times daily. Snacks should be primarily fruits, vegetables or nuts.  Medication, anti inflammatory, ibuprofen and prednisone and muscle relaxant are sent in for right shoulder pain and spasm, if it continues past 4 weeks you need to callback    Please again work on smoking cessation, you need to quit

## 2013-06-11 NOTE — Progress Notes (Signed)
  Subjective:    Patient ID: Amanda Simon, female    DOB: 07-04-1951, 62 y.o.   MRN: [id number]  HPI The PT is here for follow up and re-evaluation of chronic medical conditions, medication management and review of any available recent lab and radiology data.  Preventive health is updated, specifically  Cancer screening and Immunization.  Still needs mamogram  The PT denies any adverse reactions to current medications since the last visit.  Has had increased stress recently with her daughter being diagnosed with diabetes. Her smoking and her weight have both increased as a result      Review of Systems See HPI Denies recent fever or chills. Denies sinus pressure, nasal congestion, ear pain or sore throat. Denies chest congestion, productive cough or wheezing. Denies chest pains, palpitations and leg swelling Denies abdominal pain, nausea, vomiting,diarrhea or constipation.   Denies dysuria, frequency, hesitancy or incontinence. Denies joint pain, c/o limitation in mobility of neck with spasm Denies headaches, seizures, numbness, or tingling. Denies  Uncontrolled depression, reports increased anxiety and mild  insomnia. Denies skin break down or rash.        Objective:   Physical Exam  Patient alert and oriented and in no cardiopulmonary distress.  HEENT: No facial asymmetry, EOMI, no sinus tenderness,  oropharynx pink and moist.  Neck decreased ROM with right neck spasm, no adenopathy.  Chest: Clear to auscultation bilaterally.  CVS: S1, S2 no murmurs, no S3.  ABD: Soft non tender. Bowel sounds normal.  Ext: No edema  MS: Adequate ROM spine, shoulders, hips and knees.  Skin: Intact, no ulcerations or rash noted.  Psych: Good eye contact, normal affect. Memory intact mildy anxious not  depressed appearing.  CNS: CN 2-12 intact, power, tone and sensation normal throughout.       Assessment & Plan:

## 2013-06-12 LAB — HEMOGLOBIN A1C: Hgb A1c MFr Bld: 6.3 % — ABNORMAL HIGH (ref <5.7)

## 2013-06-12 LAB — VITAMIN D 25 HYDROXY (VIT D DEFICIENCY, FRACTURES): Vit D, 25-Hydroxy: 28 ng/mL — ABNORMAL LOW (ref 30–89)

## 2013-06-17 NOTE — Assessment & Plan Note (Signed)
Deteriorated Patient counseled for approximately 5 minutes regarding the health risks of ongoing nicotine use, specifically all types of cancer, heart disease, stroke and respiratory failure. The options available for help with cessation ,the behavioral changes to assist the process, and the option to either gradully reduce usage  Or abruptly stop.is also discussed. Pt is also encouraged to set specific goals in number of cigarettes used daily, as well as to set a quit date.    

## 2013-06-17 NOTE — Assessment & Plan Note (Signed)
Deteriorated. Patient re-educated about  the importance of commitment to a  minimum of 150 minutes of exercise per week. The importance of healthy food choices with portion control discussed. Encouraged to start a food diary, count calories and to consider  joining a support group. Sample diet sheets offered. Goals set by the patient for the next several months.    

## 2013-06-17 NOTE — Assessment & Plan Note (Signed)
Controlled, no change in medication DASH diet and commitment to daily physical activity for a minimum of 30 minutes discussed and encouraged, as a part of hypertension management. The importance of attaining a healthy weight is also discussed.  

## 2013-06-17 NOTE — Assessment & Plan Note (Signed)
Deteriorated , start metformin Patient educated about the importance of limiting  Carbohydrate intake , the need to commit to daily physical activity for a minimum of 30 minutes , and to commit weight loss. The fact that changes in all these areas will reduce or eliminate all together the development of diabetes is stressed.

## 2013-06-17 NOTE — Assessment & Plan Note (Signed)
Hyperlipidemia:Low fat diet discussed and encouraged.  Updated lab next visit 

## 2013-06-18 ENCOUNTER — Ambulatory Visit (HOSPITAL_COMMUNITY)
Admission: RE | Admit: 2013-06-18 | Discharge: 2013-06-18 | Disposition: A | Discharge status: Home / Self Care | Payer: BC Managed Care – PPO | Source: Ambulatory Visit | Attending: Family Medicine | Admitting: Family Medicine

## 2013-06-18 DIAGNOSIS — Z13820 Encounter for screening for osteoporosis: Secondary | ICD-10-CM

## 2013-06-18 DIAGNOSIS — Z780 Asymptomatic menopausal state: Secondary | ICD-10-CM | POA: Insufficient documentation

## 2013-06-18 DIAGNOSIS — M899 Disorder of bone, unspecified: Secondary | ICD-10-CM | POA: Insufficient documentation

## 2013-06-22 ENCOUNTER — Other Ambulatory Visit: Payer: Self-pay | Admitting: Family Medicine

## 2013-07-05 ENCOUNTER — Telehealth: Payer: Self-pay | Admitting: Family Medicine

## 2013-07-05 NOTE — Telephone Encounter (Signed)
Since she has been taking the metformin she has been sick she can not take this pill it makes her nausea nervous constipated just feels sick she wants to know will Dr. Roylene Reason her stop taking this she is not diabetic her husband checks her sugars for please call back

## 2013-07-06 ENCOUNTER — Other Ambulatory Visit: Payer: Self-pay | Admitting: Family Medicine

## 2013-07-06 NOTE — Telephone Encounter (Signed)
I spoke with pt and advised her to d/c the metformin

## 2013-09-26 LAB — COMPREHENSIVE METABOLIC PANEL
ALT: 14 U/L (ref 0–35)
AST: 15 U/L (ref 0–37)
Albumin: 4.3 g/dL (ref 3.5–5.2)
Alkaline Phosphatase: 84 U/L (ref 39–117)
BUN: 13 mg/dL (ref 6–23)
CO2: 32 mEq/L (ref 19–32)
Calcium: 9.2 mg/dL (ref 8.4–10.5)
Chloride: 103 mEq/L (ref 96–112)
Creat: 0.75 mg/dL (ref 0.50–1.10)
Glucose, Bld: 81 mg/dL (ref 70–99)
Potassium: 4 mEq/L (ref 3.5–5.3)
Sodium: 139 mEq/L (ref 135–145)
Total Bilirubin: 0.5 mg/dL (ref 0.3–1.2)
Total Protein: 7.3 g/dL (ref 6.0–8.3)

## 2013-09-26 LAB — HEMOGLOBIN A1C
Hgb A1c MFr Bld: 6.2 % — ABNORMAL HIGH (ref <5.7)
Mean Plasma Glucose: 131 mg/dL — ABNORMAL HIGH (ref <117)

## 2013-09-26 LAB — LIPID PANEL
Cholesterol: 172 mg/dL (ref 0–200)
HDL: 53 mg/dL (ref >39)
LDL Cholesterol: 101 mg/dL — ABNORMAL HIGH (ref 0–99)
Total CHOL/HDL Ratio: 3.2 Ratio
Triglycerides: 91 mg/dL (ref <150)
VLDL: 18 mg/dL (ref 0–40)

## 2013-09-26 LAB — CBC
HCT: 38.4 % (ref 36.0–46.0)
Hemoglobin: 12.9 g/dL (ref 12.0–15.0)
MCH: 29.5 pg (ref 26.0–34.0)
MCHC: 33.6 g/dL (ref 30.0–36.0)
MCV: 87.7 fL (ref 78.0–100.0)
Platelets: 318 10*3/uL (ref 150–400)
RBC: 4.38 MIL/uL (ref 3.87–5.11)
RDW: 13.5 % (ref 11.5–15.5)
WBC: 8.3 10*3/uL (ref 4.0–10.5)

## 2013-10-08 ENCOUNTER — Other Ambulatory Visit: Payer: Self-pay | Admitting: Family Medicine

## 2013-10-08 DIAGNOSIS — Z139 Encounter for screening, unspecified: Secondary | ICD-10-CM

## 2013-10-11 ENCOUNTER — Ambulatory Visit: Payer: BC Managed Care – PPO | Admitting: Family Medicine

## 2013-10-15 ENCOUNTER — Ambulatory Visit (HOSPITAL_COMMUNITY): Payer: BC Managed Care – PPO

## 2013-10-22 ENCOUNTER — Ambulatory Visit (HOSPITAL_COMMUNITY)
Admission: RE | Admit: 2013-10-22 | Discharge: 2013-10-22 | Disposition: A | Discharge status: Home / Self Care | Payer: BC Managed Care – PPO | Source: Ambulatory Visit | Attending: Family Medicine | Admitting: Family Medicine

## 2013-10-22 DIAGNOSIS — Z1231 Encounter for screening mammogram for malignant neoplasm of breast: Secondary | ICD-10-CM | POA: Insufficient documentation

## 2013-10-22 DIAGNOSIS — Z139 Encounter for screening, unspecified: Secondary | ICD-10-CM

## 2013-10-24 ENCOUNTER — Ambulatory Visit (INDEPENDENT_AMBULATORY_CARE_PROVIDER_SITE_OTHER): Payer: BC Managed Care – PPO | Admitting: Family Medicine

## 2013-10-24 ENCOUNTER — Encounter: Payer: Self-pay | Admitting: Family Medicine

## 2013-10-24 ENCOUNTER — Encounter (INDEPENDENT_AMBULATORY_CARE_PROVIDER_SITE_OTHER): Payer: Self-pay

## 2013-10-24 ENCOUNTER — Telehealth: Payer: Self-pay | Admitting: Family Medicine

## 2013-10-24 VITALS — BP 120/70 | HR 90 | Resp 16 | Ht 64.0 in | Wt 193.8 lb

## 2013-10-24 DIAGNOSIS — I10 Essential (primary) hypertension: Secondary | ICD-10-CM

## 2013-10-24 DIAGNOSIS — F32A Depression, unspecified: Secondary | ICD-10-CM

## 2013-10-24 DIAGNOSIS — F3289 Other specified depressive episodes: Secondary | ICD-10-CM

## 2013-10-24 DIAGNOSIS — E669 Obesity, unspecified: Secondary | ICD-10-CM

## 2013-10-24 DIAGNOSIS — E785 Hyperlipidemia, unspecified: Secondary | ICD-10-CM

## 2013-10-24 DIAGNOSIS — F17200 Nicotine dependence, unspecified, uncomplicated: Secondary | ICD-10-CM

## 2013-10-24 DIAGNOSIS — M542 Cervicalgia: Secondary | ICD-10-CM

## 2013-10-24 DIAGNOSIS — R7309 Other abnormal glucose: Secondary | ICD-10-CM

## 2013-10-24 DIAGNOSIS — H9190 Unspecified hearing loss, unspecified ear: Secondary | ICD-10-CM

## 2013-10-24 DIAGNOSIS — F329 Major depressive disorder, single episode, unspecified: Secondary | ICD-10-CM

## 2013-10-24 DIAGNOSIS — R7302 Impaired glucose tolerance (oral): Secondary | ICD-10-CM

## 2013-10-24 MED ORDER — TRAMADOL HCL 50 MG PO TABS: ORAL_TABLET | ORAL | Status: DC

## 2013-10-24 MED ORDER — PREDNISONE (PAK) 5 MG PO TABS
5.0000 mg | ORAL_TABLET | ORAL | Status: DC
Start: 2013-10-24 — End: 2014-03-01

## 2013-10-24 MED ORDER — IBUPROFEN 600 MG PO TABS: ORAL_TABLET | ORAL | Status: DC

## 2013-10-24 NOTE — Assessment & Plan Note (Signed)
Controlled, no change in medication DASH diet and commitment to daily physical activity for a minimum of 30 minutes discussed and encouraged, as a part of hypertension management. The importance of attaining a healthy weight is also discussed.  

## 2013-10-24 NOTE — Assessment & Plan Note (Signed)
Smokes between 4 to 15 cigarettes per day, depending on the stress Patient counseled for approximately 5 minutes regarding the health risks of ongoing nicotine use, specifically all types of cancer, heart disease, stroke and respiratory failure. The options available for help with cessation ,the behavioral changes to assist the process, and the option to either gradully reduce usage  Or abruptly stop.is also discussed. Pt is also encouraged to set specific goals in number of cigarettes used daily, as well as to set a quit date.

## 2013-10-24 NOTE — Assessment & Plan Note (Signed)
Coping well with stress anxiety and depression , on no meds, depending on her faith

## 2013-10-24 NOTE — Assessment & Plan Note (Signed)
Improved, pt applauded on htois No med change Hyperlipidemia:Low fat diet discussed and encouraged.

## 2013-10-24 NOTE — Assessment & Plan Note (Signed)
Imoproved. Patient educated about the importance of limiting  Carbohydrate intake , the need to commit to daily physical activity for a minimum of 30 minutes , and to commit weight loss. The fact that changes in all these areas will reduce or eliminate all together the development of diabetes is stressed.

## 2013-10-24 NOTE — Assessment & Plan Note (Addendum)
Increased pain with reduced mobility x 3 months , hand was jerked by  Her dog. Anti inflammatories in office and at home, also xray

## 2013-10-24 NOTE — Assessment & Plan Note (Signed)
Improved. Pt applauded on succesful weight loss through lifestyle change, and encouraged to continue same. Weight loss goal set for the next several months.  

## 2013-10-24 NOTE — Patient Instructions (Addendum)
F/u in 4.5 months, call if you need me before  Blood pressure and labs have improved  It is important that you exercise regularly at least 30 minutes 5 times a week. If you develop chest pain, have severe difficulty breathing, or feel very tired, stop exercising immediately and seek medical attention   please work on quitting   Fasting lipid , cmp, HBA1C and TSH in  4.5 month, before visit    Medication is sent in for the right shoulder and neck pain call back if persists  All the best with colonscopy  Try to get eye exam  Use wax softener in ears sporadically, no q tuips, nothing bigger than finger tip, you are referred to ENT for hearing evaluation

## 2013-10-24 NOTE — Progress Notes (Signed)
   Subjective:    Patient ID: Amanda Simon, female    DOB: 04/07/1951, 63 y.o.   MRN: [id number]  HPI The PT is here for follow up and re-evaluation of chronic medical conditions, medication management and review of any available recent lab and radiology data.  Preventive health is updated, specifically  Cancer screening and Immunization.  Colonoscopy planned for March States she has been under increased stress , husband has been hospitalized several times and has multiple chronic illnesses, she is depending on faith to see her through  The PT denies any adverse reactions to current medications since the last visit.  3 to 4 month h/o increased right shoulder pain and reduced mobility, her hand was jerked by her dog and this is the trigger   Review of Systems See HPI Denies recent fever or chills. Denies sinus pressure, nasal congestion, ear pain or sore throat. Denies chest congestion, productive cough or wheezing. Denies chest pains, palpitations and leg swelling Denies abdominal pain, nausea, vomiting,diarrhea or constipation.   Denies dysuria, frequency, hesitancy or incontinence.  Denies headaches, seizures, numbness, or tingling. Denies  Uncontrolled anxiety or insomnia. Denies skin break down or rash.        Objective:   Physical Exam  Patient alert and oriented and in no cardiopulmonary distress.  HEENT: No facial asymmetry, EOMI, no sinus tenderness,  oropharynx pink and moist.  Neck supple no adenopathy.TM cerumen partially occludes right ear drum  Chest: Clear to auscultation bilaterally.  CVS: S1, S2 no murmurs, no S3.  ABD: Soft non tender. Bowel sounds normal.  Ext: No edema  MS: Adequate ROM spine, , hips and knees.Decreeased ROM right shoulder  Skin: Intact, no ulcerations or rash noted.  Psych: Good eye contact, normal affect. Memory intact not anxious or depressed appearing.  CNS: CN 2-12 intact, power, tone and sensation normal throughout.Hearing  loss       Assessment & Plan:

## 2013-10-25 NOTE — Telephone Encounter (Signed)
Patient is aware 

## 2014-01-14 ENCOUNTER — Telehealth: Payer: Self-pay

## 2014-01-14 ENCOUNTER — Ambulatory Visit (HOSPITAL_COMMUNITY)
Admission: RE | Admit: 2014-01-14 | Discharge: 2014-01-14 | Disposition: A | Discharge status: Home / Self Care | Payer: BC Managed Care – PPO | Source: Ambulatory Visit | Attending: Family Medicine | Admitting: Family Medicine

## 2014-01-14 ENCOUNTER — Other Ambulatory Visit: Payer: Self-pay | Admitting: Family Medicine

## 2014-01-14 DIAGNOSIS — M542 Cervicalgia: Secondary | ICD-10-CM

## 2014-01-14 DIAGNOSIS — G8929 Other chronic pain: Secondary | ICD-10-CM

## 2014-01-14 DIAGNOSIS — M19019 Primary osteoarthritis, unspecified shoulder: Secondary | ICD-10-CM | POA: Insufficient documentation

## 2014-01-14 DIAGNOSIS — M25519 Pain in unspecified shoulder: Secondary | ICD-10-CM | POA: Insufficient documentation

## 2014-01-14 DIAGNOSIS — M5030 Other cervical disc degeneration, unspecified cervical region: Secondary | ICD-10-CM | POA: Insufficient documentation

## 2014-01-14 NOTE — Telephone Encounter (Signed)
Patient states that she is having right shoulder pain.  No acute injuries.  Pain radiates into neck and collar bone area.  Is requesting xray.  Xray entered.

## 2014-01-14 NOTE — Addendum Note (Signed)
Addended by: Denman George B on: 01/14/2014 10:52 AM   Modules accepted: Orders

## 2014-01-14 NOTE — Telephone Encounter (Signed)
Signed off on order

## 2014-01-14 NOTE — Telephone Encounter (Signed)
Noted and ordered.  Patient made aware.

## 2014-01-14 NOTE — Telephone Encounter (Signed)
Also need xray of her neck as problem mamy be in neck and she has neck pain pls let her know , enter , I will sign

## 2014-01-23 ENCOUNTER — Other Ambulatory Visit: Payer: Self-pay

## 2014-01-23 MED ORDER — METHOCARBAMOL 500 MG PO TABS: ORAL_TABLET | ORAL | Status: DC

## 2014-02-19 ENCOUNTER — Telehealth: Payer: Self-pay | Admitting: Family Medicine

## 2014-02-19 DIAGNOSIS — F32A Depression, unspecified: Secondary | ICD-10-CM

## 2014-02-19 DIAGNOSIS — F329 Major depressive disorder, single episode, unspecified: Secondary | ICD-10-CM

## 2014-02-19 DIAGNOSIS — F17200 Nicotine dependence, unspecified, uncomplicated: Secondary | ICD-10-CM

## 2014-02-19 DIAGNOSIS — R5381 Other malaise: Secondary | ICD-10-CM

## 2014-02-19 DIAGNOSIS — N760 Acute vaginitis: Secondary | ICD-10-CM

## 2014-02-19 DIAGNOSIS — E785 Hyperlipidemia, unspecified: Secondary | ICD-10-CM

## 2014-02-19 DIAGNOSIS — R5383 Other fatigue: Secondary | ICD-10-CM

## 2014-02-19 DIAGNOSIS — E669 Obesity, unspecified: Secondary | ICD-10-CM

## 2014-02-19 DIAGNOSIS — R7302 Impaired glucose tolerance (oral): Secondary | ICD-10-CM

## 2014-02-19 DIAGNOSIS — I10 Essential (primary) hypertension: Secondary | ICD-10-CM

## 2014-02-19 MED ORDER — SIMVASTATIN 40 MG PO TABS: ORAL_TABLET | ORAL | Status: DC

## 2014-02-19 NOTE — Telephone Encounter (Signed)
Med refilled.

## 2014-02-22 LAB — HEMOGLOBIN A1C
HEMOGLOBIN A1C: 6.2 % — AB (ref <5.7)
Mean Plasma Glucose: 131 mg/dL — ABNORMAL HIGH (ref <117)

## 2014-02-23 LAB — COMPREHENSIVE METABOLIC PANEL
ALBUMIN: 4 g/dL (ref 3.5–5.2)
ALK PHOS: 83 U/L (ref 39–117)
ALT: 14 U/L (ref 0–35)
AST: 16 U/L (ref 0–37)
BUN: 13 mg/dL (ref 6–23)
CO2: 31 mEq/L (ref 19–32)
Calcium: 9.3 mg/dL (ref 8.4–10.5)
Chloride: 103 mEq/L (ref 96–112)
Creat: 0.7 mg/dL (ref 0.50–1.10)
Glucose, Bld: 97 mg/dL (ref 70–99)
POTASSIUM: 4.2 meq/L (ref 3.5–5.3)
Sodium: 141 mEq/L (ref 135–145)
Total Bilirubin: 0.6 mg/dL (ref 0.2–1.2)
Total Protein: 7.1 g/dL (ref 6.0–8.3)

## 2014-02-23 LAB — TSH: TSH: 0.613 u[IU]/mL (ref 0.350–4.500)

## 2014-02-23 LAB — LIPID PANEL
Cholesterol: 164 mg/dL (ref 0–200)
HDL: 48 mg/dL (ref >39)
LDL Cholesterol: 104 mg/dL — ABNORMAL HIGH (ref 0–99)
TRIGLYCERIDES: 58 mg/dL (ref <150)
Total CHOL/HDL Ratio: 3.4 Ratio
VLDL: 12 mg/dL (ref 0–40)

## 2014-02-28 ENCOUNTER — Encounter: Payer: Self-pay | Admitting: Family Medicine

## 2014-02-28 ENCOUNTER — Ambulatory Visit (INDEPENDENT_AMBULATORY_CARE_PROVIDER_SITE_OTHER): Payer: BC Managed Care – PPO | Admitting: Family Medicine

## 2014-02-28 ENCOUNTER — Encounter (INDEPENDENT_AMBULATORY_CARE_PROVIDER_SITE_OTHER): Payer: Self-pay

## 2014-02-28 VITALS — BP 148/82 | HR 88 | Resp 16 | Ht 64.0 in | Wt 197.0 lb

## 2014-02-28 DIAGNOSIS — F17200 Nicotine dependence, unspecified, uncomplicated: Secondary | ICD-10-CM

## 2014-02-28 DIAGNOSIS — I10 Essential (primary) hypertension: Secondary | ICD-10-CM

## 2014-02-28 DIAGNOSIS — E785 Hyperlipidemia, unspecified: Secondary | ICD-10-CM

## 2014-02-28 DIAGNOSIS — R7302 Impaired glucose tolerance (oral): Secondary | ICD-10-CM

## 2014-02-28 DIAGNOSIS — R7309 Other abnormal glucose: Secondary | ICD-10-CM

## 2014-02-28 DIAGNOSIS — M542 Cervicalgia: Secondary | ICD-10-CM

## 2014-02-28 DIAGNOSIS — E669 Obesity, unspecified: Secondary | ICD-10-CM

## 2014-02-28 MED ORDER — GABAPENTIN 300 MG PO CAPS: 300.0000 mg | ORAL_CAPSULE | Freq: Every day | ORAL | Status: DC

## 2014-02-28 NOTE — Patient Instructions (Addendum)
F/u in 4.5 month, call if you need me before  You are referred for MRI of your neck  New for neck pain is gabapentin 300mg  one at night  Pls cut back on cigarettes and relax more   BP is high and bad cholesterol is high, pls cut back on fat and sugar in your diet

## 2014-03-01 NOTE — Assessment & Plan Note (Addendum)
Unchanged, nubmness and sticking pains in her right shoulder which disturb her sleep. X ray of neck abnormal, pt has failed trial of anti inflammatory Will order MRI of c spine , clinically has nerve impingement, trial of gabapentin

## 2014-03-01 NOTE — Progress Notes (Signed)
Subjective:    Patient ID: Amanda Simon, female    DOB: 03/16/1951, 63 y.o.   MRN: [id number]  HPI  The PT is here for follow up and re-evaluation of chronic medical conditions, medication management and review of any available recent lab and radiology data.  Preventive health is updated, specifically  Cancer screening and Immunization.   Questions or concerns regarding consultations or procedures which the PT has had in the interim are  addressed. The PT denies any adverse reactions to current medications since the last visit.  Neck and right shoulder pan is no butter, c/o numbness and tingling in shoulder , sleep is disturbed by pain   Review of Systems See HPI Denies recent fever or chills. Denies sinus pressure, nasal congestion, ear pain or sore throat. Denies chest congestion, productive cough or wheezing. Denies chest pains, palpitations and leg swelling Denies abdominal pain, nausea, vomiting,diarrhea or constipation.   Denies dysuria, frequency, hesitancy or incontinence. Denies headaches, seizures, numbness, or tingling. Denies depression, does have incresed anxiety and at times mild  insomnia. Denies skin break down or rash.        Objective:   Physical Exam BP 148/82  Pulse 88  Resp 16  Ht 5\' 4"  (1.626 m)  Wt 197 lb (89.359 kg)  BMI 33.80 kg/m2  SpO2 97% Patient alert and oriented and in no cardiopulmonary distress.  HEENT: No facial asymmetry, EOMI,   oropharynx pink and moist.  Neck decreased ROM with tenderness posterior right neck and right trapezius spasm, no JVD, no mass.  Chest: Clear to auscultation bilaterally.  CVS: S1, S2 no murmurs, no S3.  ABD: Soft non tender.   Ext: No edema  MS: Adequate ROM spine, shoulders, hips and knees.  Skin: Intact, no ulcerations or rash noted.  Psych: Good eye contact, normal affect. Memory intact mildly anxious not depressed appearing.  CNS: CN 2-12 intact, power,  normal throughout.no focal deficits  noted.        Assessment & Plan:  Nicotine dependence Unchanged, pt reports being under increased stress , counseled re need to quit Patient counseled for approximately 5 minutes regarding the health risks of ongoing nicotine use, specifically all types of cancer, heart disease, stroke and respiratory failure. The options available for help with cessation ,the behavioral changes to assist the process, and the option to either gradully reduce usage  Or abruptly stop.is also discussed. Pt is also encouraged to set specific goals in number of cigarettes used daily, as well as to set a quit date.   Neck pain on right side Unchanged, nubmness and sticking pains in her right shoulder which disturb her sleep. X ray of neck abnormal, pt has failed trial of anti inflammatory Will order MRI of c spine , clinically has nerve impingement, trial of gabapentin   IGT (impaired glucose tolerance) Unchanged Patient educated about the importance of limiting  Carbohydrate intake , the need to commit to daily physical activity for a minimum of 30 minutes , and to commit weight loss. The fact that changes in all these areas will reduce or eliminate all together the development of diabetes is stressed.     HYPERTENSION UnControlled, no change in medication, generally well controlled and pt extra anxious today, sttaes road was extremely challenging DASH diet and commitment to daily physical activity for a minimum of 30 minutes discussed and encouraged, as a part of hypertension management. The importance of attaining a healthy weight is also discussed.   HYPERLIPIDEMIA Deteriorated  Hyperlipidemia:Low fat diet discussed and encouraged.  No med change  OBESITY Deteriorated. Patient re-educated about  the importance of commitment to a  minimum of 150 minutes of exercise per week. The importance of healthy food choices with portion control discussed. Encouraged to start a food diary, count calories  and to consider  joining a support group. Sample diet sheets offered. Goals set by the patient for the next several months.

## 2014-03-01 NOTE — Assessment & Plan Note (Signed)
Unchanged Patient educated about the importance of limiting  Carbohydrate intake , the need to commit to daily physical activity for a minimum of 30 minutes , and to commit weight loss. The fact that changes in all these areas will reduce or eliminate all together the development of diabetes is stressed.    

## 2014-03-01 NOTE — Assessment & Plan Note (Signed)
Unchanged, pt reports being under increased stress , counseled re need to quit Patient counseled for approximately 5 minutes regarding the health risks of ongoing nicotine use, specifically all types of cancer, heart disease, stroke and respiratory failure. The options available for help with cessation ,the behavioral changes to assist the process, and the option to either gradully reduce usage  Or abruptly stop.is also discussed. Pt is also encouraged to set specific goals in number of cigarettes used daily, as well as to set a quit date.

## 2014-03-01 NOTE — Assessment & Plan Note (Signed)
Deteriorated. Patient re-educated about  the importance of commitment to a  minimum of 150 minutes of exercise per week. The importance of healthy food choices with portion control discussed. Encouraged to start a food diary, count calories and to consider  joining a support group. Sample diet sheets offered. Goals set by the patient for the next several months.    

## 2014-03-01 NOTE — Assessment & Plan Note (Addendum)
UnControlled, no change in medication, generally well controlled and pt extra anxious today, sttaes road was extremely challenging DASH diet and commitment to daily physical activity for a minimum of 30 minutes discussed and encouraged, as a part of hypertension management. The importance of attaining a healthy weight is also discussed.

## 2014-03-01 NOTE — Assessment & Plan Note (Signed)
Deteriorated Hyperlipidemia:Low fat diet discussed and encouraged.  No med change 

## 2014-03-14 ENCOUNTER — Other Ambulatory Visit: Payer: Self-pay

## 2014-03-14 ENCOUNTER — Telehealth: Payer: Self-pay | Admitting: *Deleted

## 2014-03-14 MED ORDER — LOSARTAN POTASSIUM-HCTZ 100-25 MG PO TABS: ORAL_TABLET | ORAL | Status: DC

## 2014-03-14 NOTE — Telephone Encounter (Signed)
Pt called wanted to get a refilled of her blood pressure pills 3 month supply. Please advise

## 2014-03-14 NOTE — Telephone Encounter (Signed)
Med refilled.

## 2014-04-04 ENCOUNTER — Ambulatory Visit: Payer: BC Managed Care – PPO | Admitting: Family Medicine

## 2014-06-20 ENCOUNTER — Telehealth: Payer: Self-pay | Admitting: Family Medicine

## 2014-06-20 DIAGNOSIS — I10 Essential (primary) hypertension: Secondary | ICD-10-CM

## 2014-06-20 DIAGNOSIS — R7301 Impaired fasting glucose: Secondary | ICD-10-CM

## 2014-06-20 DIAGNOSIS — E785 Hyperlipidemia, unspecified: Secondary | ICD-10-CM

## 2014-06-20 NOTE — Telephone Encounter (Signed)
Will mail order

## 2014-06-24 ENCOUNTER — Ambulatory Visit: Payer: BC Managed Care – PPO | Admitting: Family Medicine

## 2014-07-30 LAB — LIPID PANEL
CHOLESTEROL: 169 mg/dL (ref 0–200)
HDL: 53 mg/dL (ref >39)
LDL Cholesterol: 100 mg/dL — ABNORMAL HIGH (ref 0–99)
Total CHOL/HDL Ratio: 3.2 Ratio
Triglycerides: 81 mg/dL (ref <150)
VLDL: 16 mg/dL (ref 0–40)

## 2014-07-30 LAB — HEMOGLOBIN A1C
Hgb A1c MFr Bld: 6.1 % — ABNORMAL HIGH (ref <5.7)
MEAN PLASMA GLUCOSE: 128 mg/dL — AB (ref <117)

## 2014-07-30 LAB — COMPREHENSIVE METABOLIC PANEL
ALT: 13 U/L (ref 0–35)
AST: 15 U/L (ref 0–37)
Albumin: 4.3 g/dL (ref 3.5–5.2)
Alkaline Phosphatase: 88 U/L (ref 39–117)
BUN: 13 mg/dL (ref 6–23)
CO2: 29 mEq/L (ref 19–32)
CREATININE: 0.77 mg/dL (ref 0.50–1.10)
Calcium: 9.4 mg/dL (ref 8.4–10.5)
Chloride: 103 mEq/L (ref 96–112)
Glucose, Bld: 93 mg/dL (ref 70–99)
POTASSIUM: 3.9 meq/L (ref 3.5–5.3)
Sodium: 140 mEq/L (ref 135–145)
Total Bilirubin: 0.6 mg/dL (ref 0.2–1.2)
Total Protein: 7.2 g/dL (ref 6.0–8.3)

## 2014-07-30 LAB — TSH: TSH: 1.401 u[IU]/mL (ref 0.350–4.500)

## 2014-08-09 ENCOUNTER — Other Ambulatory Visit: Payer: Self-pay | Admitting: Family Medicine

## 2014-08-13 ENCOUNTER — Ambulatory Visit: Payer: BC Managed Care – PPO | Admitting: Family Medicine

## 2014-09-24 ENCOUNTER — Ambulatory Visit (INDEPENDENT_AMBULATORY_CARE_PROVIDER_SITE_OTHER): Payer: Commercial Managed Care - HMO | Admitting: Family Medicine

## 2014-09-24 ENCOUNTER — Encounter: Payer: Self-pay | Admitting: Family Medicine

## 2014-09-24 ENCOUNTER — Ambulatory Visit (INDEPENDENT_AMBULATORY_CARE_PROVIDER_SITE_OTHER): Payer: Commercial Managed Care - HMO

## 2014-09-24 VITALS — BP 118/80 | HR 94 | Resp 16 | Ht 64.0 in | Wt 203.0 lb

## 2014-09-24 DIAGNOSIS — E785 Hyperlipidemia, unspecified: Secondary | ICD-10-CM | POA: Diagnosis not present

## 2014-09-24 DIAGNOSIS — Z23 Encounter for immunization: Secondary | ICD-10-CM

## 2014-09-24 DIAGNOSIS — I10 Essential (primary) hypertension: Secondary | ICD-10-CM | POA: Diagnosis not present

## 2014-09-24 DIAGNOSIS — F17208 Nicotine dependence, unspecified, with other nicotine-induced disorders: Secondary | ICD-10-CM | POA: Diagnosis not present

## 2014-09-24 DIAGNOSIS — R7302 Impaired glucose tolerance (oral): Secondary | ICD-10-CM

## 2014-09-24 DIAGNOSIS — Z1211 Encounter for screening for malignant neoplasm of colon: Secondary | ICD-10-CM

## 2014-09-24 DIAGNOSIS — IMO0001 Reserved for inherently not codable concepts without codable children: Secondary | ICD-10-CM

## 2014-09-24 NOTE — Assessment & Plan Note (Signed)
Vaccine administered at visit.  

## 2014-09-24 NOTE — Assessment & Plan Note (Signed)
Deteriorated. Patient re-educated about  the importance of commitment to a  minimum of 150 minutes of exercise per week. The importance of healthy food choices with portion control discussed. Encouraged to start a food diary, count calories and to consider  joining a support group. Sample diet sheets offered. Goals set by the patient for the next several months.    

## 2014-09-24 NOTE — Progress Notes (Signed)
Subjective:    Patient ID: Amanda Simon, female    DOB: [birth year], 64 y.o.   MRN: [id number]  HPI The PT is here for follow up and re-evaluation of chronic medical conditions, medication management and review of any available recent lab and radiology data.  Preventive health is updated, specifically  Cancer screening and Immunization.  Needs colonoscopy . The PT denies any adverse reactions to current medications since the last visit.  There are no new concerns.  There are no specific complaints       Review of Systems See HPI Denies recent fever or chills. Denies sinus pressure, nasal congestion, ear pain or sore throat. Denies chest congestion, productive cough or wheezing. Denies chest pains, palpitations and leg swelling Denies abdominal pain, nausea, vomiting,diarrhea or constipation.   Denies dysuria, frequency, hesitancy or incontinence. Denies joint pain, swelling and limitation in mobility. Denies headaches, seizures, numbness, or tingling. Denies depression, uncontrolled  anxiety or insomnia. Denies skin break down or rash.        Objective:   Physical Exam BP 118/80 mmHg  Pulse 94  Resp 16  Ht 5\' 4"  (1.626 m)  Wt 203 lb (92.08 kg)  BMI 34.83 kg/m2  SpO2 96% Patient alert and oriented and in no cardiopulmonary distress.  HEENT: No facial asymmetry, EOMI,   oropharynx pink and moist.  Neck supple no JVD, no mass.  Chest: Clear to auscultation bilaterally.  CVS: S1, S2 no murmurs, no S3.Regular rate.  ABD: Soft non tender.   Ext: No edema  MS: Adequate ROM spine, shoulders, hips and knees.  Skin: Intact, no ulcerations or rash noted.  Psych: Good eye contact, normal affect. Memory intact not anxious or depressed appearing.  CNS: CN 2-12 intact, power,  normal throughout.no focal deficits noted.        Assessment & Plan:  Nicotine dependence Slowly improving , trying to quit Patient counseled for approximately 5 minutes regarding the  health risks of ongoing nicotine use, specifically all types of cancer, heart disease, stroke and respiratory failure. The options available for help with cessation ,the behavioral changes to assist the process, and the option to either gradully reduce usage  Or abruptly stop.is also discussed. Pt is also encouraged to set specific goals in number of cigarettes used daily, as well as to set a quit date.   Essential hypertension Controlled, no change in medication DASH diet and commitment to daily physical activity for a minimum of 30 minutes discussed and encouraged, as a part of hypertension management. The importance of attaining a healthy weight is also discussed.   Obesity, Class II, BMI 35-39.9, with comorbidity Deteriorated. Patient re-educated about  the importance of commitment to a  minimum of 150 minutes of exercise per week. The importance of healthy food choices with portion control discussed. Encouraged to start a food diary, count calories and to consider  joining a support group. Sample diet sheets offered. Goals set by the patient for the next several months.     Need for prophylactic vaccination and inoculation against influenza Vaccine administered at visit.   Hyperlipemia Elevated LDL Hyperlipidemia:Low fat diet discussed and encouraged.  Updated lab needed at/ before next visit.   IGT (impaired glucose tolerance) Patient educated about the importance of limiting  Carbohydrate intake , the need to commit to daily physical activity for a minimum of 30 minutes , and to commit weight loss. The fact that changes in all these areas will reduce or eliminate all together the  development of diabetes is stressed.   Updated lab needed at/ before next visit.

## 2014-09-24 NOTE — Patient Instructions (Addendum)
Welcome to medicare  in  5 month, call if you need me before Please schedule your mammogram  You are referred to Dr Laural Golden for colonoscopy, look for a letter in the mail  , fasting lipid, cmp in 5 month  All the best for 2016

## 2014-09-24 NOTE — Assessment & Plan Note (Signed)
Slowly improving , trying to quit Patient counseled for approximately 5 minutes regarding the health risks of ongoing nicotine use, specifically all types of cancer, heart disease, stroke and respiratory failure. The options available for help with cessation ,the behavioral changes to assist the process, and the option to either gradully reduce usage  Or abruptly stop.is also discussed. Pt is also encouraged to set specific goals in number of cigarettes used daily, as well as to set a quit date.

## 2014-09-24 NOTE — Assessment & Plan Note (Signed)
Controlled, no change in medication DASH diet and commitment to daily physical activity for a minimum of 30 minutes discussed and encouraged, as a part of hypertension management. The importance of attaining a healthy weight is also discussed.  

## 2014-09-26 ENCOUNTER — Encounter (INDEPENDENT_AMBULATORY_CARE_PROVIDER_SITE_OTHER): Payer: Self-pay | Admitting: *Deleted

## 2014-09-29 NOTE — Assessment & Plan Note (Signed)
Elevated LDL Hyperlipidemia:Low fat diet discussed and encouraged.  Updated lab needed at/ before next visit.

## 2014-09-29 NOTE — Assessment & Plan Note (Signed)
Patient educated about the importance of limiting  Carbohydrate intake , the need to commit to daily physical activity for a minimum of 30 minutes , and to commit weight loss. The fact that changes in all these areas will reduce or eliminate all together the development of diabetes is stressed.   Updated lab needed at/ before next visit.  

## 2014-10-03 ENCOUNTER — Other Ambulatory Visit: Payer: Self-pay

## 2014-10-03 ENCOUNTER — Telehealth: Payer: Self-pay | Admitting: Family Medicine

## 2014-10-03 MED ORDER — SIMVASTATIN 40 MG PO TABS: 40.0000 mg | ORAL_TABLET | Freq: Every day | ORAL | Status: DC

## 2014-10-03 NOTE — Telephone Encounter (Signed)
Med refilled.

## 2014-10-28 ENCOUNTER — Other Ambulatory Visit: Payer: Self-pay | Admitting: Family Medicine

## 2014-12-18 ENCOUNTER — Other Ambulatory Visit: Payer: Self-pay | Admitting: Family Medicine

## 2014-12-18 DIAGNOSIS — Z1231 Encounter for screening mammogram for malignant neoplasm of breast: Secondary | ICD-10-CM

## 2014-12-19 ENCOUNTER — Other Ambulatory Visit (INDEPENDENT_AMBULATORY_CARE_PROVIDER_SITE_OTHER): Payer: Self-pay | Admitting: *Deleted

## 2014-12-19 DIAGNOSIS — Z1211 Encounter for screening for malignant neoplasm of colon: Secondary | ICD-10-CM

## 2015-01-17 ENCOUNTER — Telehealth (INDEPENDENT_AMBULATORY_CARE_PROVIDER_SITE_OTHER): Payer: Self-pay | Admitting: *Deleted

## 2015-01-17 DIAGNOSIS — Z1211 Encounter for screening for malignant neoplasm of colon: Secondary | ICD-10-CM

## 2015-01-17 MED ORDER — PEG 3350-KCL-NA BICARB-NACL 420 G PO SOLR: 4000.0000 mL | Freq: Once | ORAL | Status: DC

## 2015-01-17 NOTE — Telephone Encounter (Signed)
Patient needs trilyte 

## 2015-01-24 ENCOUNTER — Telehealth (INDEPENDENT_AMBULATORY_CARE_PROVIDER_SITE_OTHER): Payer: Self-pay | Admitting: *Deleted

## 2015-01-24 NOTE — Telephone Encounter (Signed)
Referring MD/PCP: simpson   Procedure: tcs  Reason/Indication:  screening  Has patient had this procedure before?  Yes, over 10 years ago  If so, when, by whom and where?    Is there a family history of colon cancer?  no  Who?  What age when diagnosed?    Is patient diabetic?   no      Does patient have prosthetic heart valve?  no  Do you have a pacemaker?  no  Has patient ever had endocarditis? no  Has patient had joint replacement within last 12 months?  no  Does patient tend to be constipated or take laxatives? no  Is patient on Coumadin, Plavix and/or Aspirin? yes  Medications: asa 81 mg daily, losartan/hctz, simvastatin   Allergies: nkda  Medication Adjustment: asa 2 days  Procedure date & time: 02/20/15 at 930

## 2015-01-25 DIAGNOSIS — I10 Essential (primary) hypertension: Secondary | ICD-10-CM | POA: Diagnosis not present

## 2015-01-25 DIAGNOSIS — E785 Hyperlipidemia, unspecified: Secondary | ICD-10-CM | POA: Diagnosis not present

## 2015-01-25 LAB — COMPREHENSIVE METABOLIC PANEL
ALT: 13 U/L (ref 0–35)
AST: 15 U/L (ref 0–37)
Albumin: 3.9 g/dL (ref 3.5–5.2)
Alkaline Phosphatase: 85 U/L (ref 39–117)
BUN: 14 mg/dL (ref 6–23)
CALCIUM: 9.1 mg/dL (ref 8.4–10.5)
CHLORIDE: 103 meq/L (ref 96–112)
CO2: 30 mEq/L (ref 19–32)
CREATININE: 0.75 mg/dL (ref 0.50–1.10)
Glucose, Bld: 97 mg/dL (ref 70–99)
Potassium: 4.1 mEq/L (ref 3.5–5.3)
Sodium: 140 mEq/L (ref 135–145)
Total Bilirubin: 0.4 mg/dL (ref 0.2–1.2)
Total Protein: 7 g/dL (ref 6.0–8.3)

## 2015-01-25 LAB — LIPID PANEL
Cholesterol: 164 mg/dL (ref 0–200)
HDL: 47 mg/dL (ref >=46)
LDL Cholesterol: 97 mg/dL (ref 0–99)
TRIGLYCERIDES: 101 mg/dL (ref <150)
Total CHOL/HDL Ratio: 3.5 Ratio
VLDL: 20 mg/dL (ref 0–40)

## 2015-01-30 NOTE — Telephone Encounter (Signed)
agree

## 2015-02-05 ENCOUNTER — Ambulatory Visit (INDEPENDENT_AMBULATORY_CARE_PROVIDER_SITE_OTHER): Payer: Commercial Managed Care - HMO | Admitting: Family Medicine

## 2015-02-05 ENCOUNTER — Encounter: Payer: Self-pay | Admitting: Family Medicine

## 2015-02-05 ENCOUNTER — Ambulatory Visit (HOSPITAL_COMMUNITY)
Admission: RE | Admit: 2015-02-05 | Discharge: 2015-02-05 | Disposition: A | Discharge status: Home / Self Care | Payer: Commercial Managed Care - HMO | Source: Ambulatory Visit | Attending: Family Medicine | Admitting: Family Medicine

## 2015-02-05 ENCOUNTER — Other Ambulatory Visit (HOSPITAL_COMMUNITY): Payer: Commercial Managed Care - HMO

## 2015-02-05 VITALS — BP 118/78 | HR 88 | Resp 16 | Ht 65.5 in | Wt 205.0 lb

## 2015-02-05 DIAGNOSIS — E559 Vitamin D deficiency, unspecified: Secondary | ICD-10-CM

## 2015-02-05 DIAGNOSIS — Z0000 Encounter for general adult medical examination without abnormal findings: Secondary | ICD-10-CM | POA: Insufficient documentation

## 2015-02-05 DIAGNOSIS — E785 Hyperlipidemia, unspecified: Secondary | ICD-10-CM

## 2015-02-05 DIAGNOSIS — Z1231 Encounter for screening mammogram for malignant neoplasm of breast: Secondary | ICD-10-CM | POA: Insufficient documentation

## 2015-02-05 DIAGNOSIS — F17209 Nicotine dependence, unspecified, with unspecified nicotine-induced disorders: Secondary | ICD-10-CM

## 2015-02-05 DIAGNOSIS — Z23 Encounter for immunization: Secondary | ICD-10-CM | POA: Diagnosis not present

## 2015-02-05 DIAGNOSIS — I10 Essential (primary) hypertension: Secondary | ICD-10-CM

## 2015-02-05 DIAGNOSIS — R7302 Impaired glucose tolerance (oral): Secondary | ICD-10-CM

## 2015-02-05 DIAGNOSIS — F418 Other specified anxiety disorders: Secondary | ICD-10-CM | POA: Insufficient documentation

## 2015-02-05 LAB — POCT URINALYSIS DIPSTICK
BILIRUBIN UA: NEGATIVE
GLUCOSE UA: NEGATIVE
KETONES UA: NEGATIVE
Leukocytes, UA: NEGATIVE
Nitrite, UA: NEGATIVE
PH UA: 5.5
Protein, UA: NEGATIVE
RBC UA: NEGATIVE
Spec Grav, UA: >=1.03
Urobilinogen, UA: 0.2

## 2015-02-05 MED ORDER — BUPROPION HCL ER (XL) 300 MG PO TB24: 300.0000 mg | ORAL_TABLET | Freq: Every day | ORAL | Status: DC

## 2015-02-05 NOTE — Progress Notes (Signed)
Subjective:    Patient ID: Amanda Simon, female    DOB: September 19, 1951, 64 y.o.   MRN: [id number]  HPI Preventive Screening-Counseling & Management   Patient present here today for a Medicare annual wellness visit.   Current Problems (verified)   Medications Prior to Visit Allergies (verified)   PAST HISTORY  Family History (verified)   Social History Married for 2 years and has 2 children and 3 grandchildren and 3 great grandchildren, Quit smoking first week in April and has had 2 since then    Risk Factors  Current exercise habits:  Walks her dogs all around her property at least every other day, no exercise routine   Dietary issues discussed: Heart healthy diet, tries to limit fried fatty foods and carbs    Cardiac risk factors: None   Depression Screen  (Note: if answer to either of the following is "Yes", a more complete depression screening is indicated)   Over the past two weeks, have you felt down, depressed or hopeless? No  Over the past two weeks, have you felt little interest or pleasure in doing things? No  Have you lost interest or pleasure in daily life? No  Do you often feel hopeless? No  Do you cry easily over simple problems? No   Activities of Daily Living  In your present state of health, do you have any difficulty performing the following activities?  Driving?: No Managing money?: No Feeding yourself?:No Getting from bed to chair?:No Climbing a flight of stairs?:No Preparing food and eating?:No Bathing or showering?:No Getting dressed?:No Getting to the toilet?:No Using the toilet?:No Moving around from place to place?: No  Fall Risk Assessment In the past year have you fallen or had a near fall?:No Are you currently taking any medications that make you dizzy?:No   Hearing Difficulties: No Do you often ask people to speak up or repeat themselves?:No Do you experience ringing or noises in your ears?:No Do you have difficulty  understanding soft or whispered voices?:No  Cognitive Testing  Alert? Yes Normal Appearance?Yes  Oriented to person? Yes Place? Yes  Time? Yes  Displays appropriate judgment?Yes  Can read the correct time from a watch face? yes Are you having problems remembering things?No  Advanced Directives have been discussed with the patient?Yes, no living will but brochure given , full code   List the Names of Other Physician/Practitioners you currently use:    Indicate any recent Medical Services you may have received from other than [facility] providers in the past year (date may be approximate).   Assessment:    Annual Wellness Exam   Plan:    Medicare Attestation  I have personally reviewed:  The patient's medical and social history  Their use of alcohol, tobacco or illicit drugs  Their current medications and supplements  The patient's functional ability including ADLs,fall risks, home safety risks, cognitive, and hearing and visual impairment  Diet and physical activities  Evidence for depression or mood disorders  The patient's weight, height, BMI, and visual acuity have been recorded in the chart. I have made referrals, counseling, and provided education to the patient based on review of the above and I have provided the patient with a written personalized care plan for preventive services.      Review of Systems     Objective:   Physical Exam BP 118/78 mmHg  Pulse 88  Resp 16  Ht 5' 5.5" (1.664 m)  Wt 205 lb (92.987 kg)  BMI 33.58 kg/m2  SpO2 99%   EKG: normal sinus rhythm, no ischemic changes, rate 89  Urinalysis: normal     Assessment & Plan:  Welcome to Medicare preventive visit Annual exam as documented. Counseling done  re healthy lifestyle involving commitment to 150 minutes exercise per week, heart healthy diet, and attaining healthy weight.The importance of adequate sleep also discussed. Regular seat belt use and home safety, is also discussed. Changes in  health habits are decided on by the patient with goals and time frames  set for achieving them. Immunization and cancer screening needs are specifically addressed at this visit. Urinalysis normal, EKG shows normal sinus rhythm with no ischemia   Need for vaccination with 13-polyvalent pneumococcal conjugate vaccine After obtaining informed consent, the vaccine is  administered by LPN.    Nicotine dependence Patient counseled for approximately 5 minutes regarding the health risks of ongoing nicotine use, specifically all types of cancer, heart disease, stroke and respiratory failure. The options available for help with cessation ,the behavioral changes to assist the process, and the option to either gradully reduce usage  Or abruptly stop.is also discussed. Pt is also encouraged to set specific goals in number of cigarettes used daily, as well as to set a quit date.  Number of cigarettes/cigars currently smoking daily: 3  Wants to quit    Depression with anxiety Counseled and medication started, PhQ 9 score of 9  Has done well in the past with wellbutrin, same to be resumed

## 2015-02-05 NOTE — Assessment & Plan Note (Signed)

## 2015-02-05 NOTE — Assessment & Plan Note (Signed)
After obtaining informed consent, the vaccine is  administered by LPN.  

## 2015-02-05 NOTE — Patient Instructions (Addendum)
Annual physical exam in 5 month,, call if you need me before  Prevnar today  New for depression is wellbutrin XR 300 mg one daily  QUIT DATE is tODAY, 02/04/2018  Fasting lipid, cnp , hBa1C, , vit D and CBC

## 2015-02-05 NOTE — Assessment & Plan Note (Signed)
Counseled and medication started, PhQ 9 score of 9  Has done well in the past with wellbutrin, same to be resumed

## 2015-02-05 NOTE — Assessment & Plan Note (Addendum)
Annual exam as documented. Counseling done  re healthy lifestyle involving commitment to 150 minutes exercise per week, heart healthy diet, and attaining healthy weight.The importance of adequate sleep also discussed. Regular seat belt use and home safety, is also discussed. Changes in health habits are decided on by the patient with goals and time frames  set for achieving them. Immunization and cancer screening needs are specifically addressed at this visit. Urinalysis normal, EKG shows normal sinus rhythm with no ischemia

## 2015-02-20 ENCOUNTER — Encounter (HOSPITAL_COMMUNITY): Payer: Self-pay | Admitting: *Deleted

## 2015-02-20 ENCOUNTER — Ambulatory Visit (HOSPITAL_COMMUNITY)
Admission: RE | Admit: 2015-02-20 | Discharge: 2015-02-20 | Disposition: A | Discharge status: Home / Self Care | Payer: Commercial Managed Care - HMO | Source: Ambulatory Visit | Attending: Internal Medicine | Admitting: Internal Medicine

## 2015-02-20 ENCOUNTER — Encounter (HOSPITAL_COMMUNITY)
Admission: RE | Disposition: A | Discharge status: Home / Self Care | Payer: Self-pay | Source: Ambulatory Visit | Attending: Internal Medicine

## 2015-02-20 DIAGNOSIS — E669 Obesity, unspecified: Secondary | ICD-10-CM | POA: Insufficient documentation

## 2015-02-20 DIAGNOSIS — F329 Major depressive disorder, single episode, unspecified: Secondary | ICD-10-CM | POA: Diagnosis not present

## 2015-02-20 DIAGNOSIS — E785 Hyperlipidemia, unspecified: Secondary | ICD-10-CM | POA: Diagnosis not present

## 2015-02-20 DIAGNOSIS — M8580 Other specified disorders of bone density and structure, unspecified site: Secondary | ICD-10-CM | POA: Diagnosis not present

## 2015-02-20 DIAGNOSIS — Z7982 Long term (current) use of aspirin: Secondary | ICD-10-CM | POA: Insufficient documentation

## 2015-02-20 DIAGNOSIS — F17200 Nicotine dependence, unspecified, uncomplicated: Secondary | ICD-10-CM | POA: Diagnosis not present

## 2015-02-20 DIAGNOSIS — D125 Benign neoplasm of sigmoid colon: Secondary | ICD-10-CM | POA: Insufficient documentation

## 2015-02-20 DIAGNOSIS — K5730 Diverticulosis of large intestine without perforation or abscess without bleeding: Secondary | ICD-10-CM | POA: Insufficient documentation

## 2015-02-20 DIAGNOSIS — Z1211 Encounter for screening for malignant neoplasm of colon: Secondary | ICD-10-CM | POA: Diagnosis not present

## 2015-02-20 DIAGNOSIS — I10 Essential (primary) hypertension: Secondary | ICD-10-CM | POA: Insufficient documentation

## 2015-02-20 DIAGNOSIS — Z79899 Other long term (current) drug therapy: Secondary | ICD-10-CM | POA: Diagnosis not present

## 2015-02-20 HISTORY — PX: COLONOSCOPY: SHX5424

## 2015-02-20 SURGERY — COLONOSCOPY
Anesthesia: Moderate Sedation

## 2015-02-20 MED ORDER — MIDAZOLAM HCL 5 MG/5ML IJ SOLN
INTRAMUSCULAR | Status: DC | PRN
  Administered 2015-02-20 (×2): 2 mg via INTRAVENOUS

## 2015-02-20 MED ORDER — MEPERIDINE HCL 50 MG/ML IJ SOLN
INTRAMUSCULAR | Status: AC
  Filled 2015-02-20: qty 1

## 2015-02-20 MED ORDER — SIMETHICONE 40 MG/0.6ML PO SUSP
ORAL | Status: DC | PRN
  Administered 2015-02-20: 10:00:00

## 2015-02-20 MED ORDER — MIDAZOLAM HCL 5 MG/5ML IJ SOLN
INTRAMUSCULAR | Status: AC
  Filled 2015-02-20: qty 10

## 2015-02-20 MED ORDER — MEPERIDINE HCL 50 MG/ML IJ SOLN
INTRAMUSCULAR | Status: DC | PRN
  Administered 2015-02-20 (×2): 25 mg

## 2015-02-20 MED ORDER — SODIUM CHLORIDE 0.9 % IV SOLN
INTRAVENOUS | Status: DC
  Administered 2015-02-20: 08:00:00 via INTRAVENOUS

## 2015-02-20 NOTE — Op Note (Signed)
COLONOSCOPY PROCEDURE REPORT  PATIENT:  Amanda Simon  MR#:  [id number] Birthdate:  1951-05-22, 64 y.o., female Endoscopist:  Dr. Rogene Houston, MD Referred By:  Dr. Tula Nakayama, MD  Procedure Date: 02/20/2015  Procedure:   Colonoscopy  Indications:  Patient is 64 year old African-American female who is undergoing average risk screening colonoscopy.  Informed Consent:  The procedure and risks were reviewed with the patient and informed consent was obtained.  Medications:  Demerol 50 mg IV Versed 4 mg IV  Description of procedure:  After a digital rectal exam was performed, that colonoscope was advanced from the anus through the rectum and colon to the area of the cecum, ileocecal valve and appendiceal orifice. The cecum was deeply intubated. These structures were well-seen and photographed for the record. From the level of the cecum and ileocecal valve, the scope was slowly and cautiously withdrawn. The mucosal surfaces were carefully surveyed utilizing scope tip to flexion to facilitate fold flattening as needed. The scope was pulled down into the rectum where a thorough exam including retroflexion was performed.  Findings:   Prep fair to satisfactory. Small polyp cold snared from mid sigmoid colon. 5 mm polyp hot snared from distal sigmoid colon. Both of these polyps were submitted together. Few small diverticula at sigmoid colon. Normal rectum and anorectal junction.   Therapeutic/Diagnostic Maneuvers Performed:  See above  Complications:  None  Cecal Withdrawal Time:  17 minutes  Impression:  Examination performed to cecum. Two small polyps were removed from sigmoid colon and submitted together. One was cold snared and the other one hot snared. Mild sigmoid colon diverticulosis.  Recommendations:  Standard instructions given. I will contact patient with biopsy results and further recommendations.  REHMAN,NAJEEB U  02/20/2015 10:15 AM  CC: Dr. Tula Nakayama, MD  & Dr. Rayne Du ref. provider found

## 2015-02-20 NOTE — H&P (Signed)
Amanda Simon is an 64 y.o. female.   Chief Complaint: Patient is here for colonoscopy. HPI: Patient is 64 year old African-American female who is here for screening colonoscopy. She denies abdominal pain change in bowel habits or rectal bleeding. Last colonoscopy was in March 2006 and was normal. Family history is negative for CRC.  Past Medical History  Diagnosis Date  . Osteopenia   . Obesity   . Hyperlipidemia   . Hypertension   . IGT (impaired glucose tolerance) 2014  . Depression     Past Surgical History  Procedure Laterality Date  . Partial hysterectomy    . Tubal ligation    . Abdominal hysterectomy  1975    fibroids    Family History  Problem Relation Age of Onset  . Lung cancer Mother   . Aneurysm Father     brain   . Hypertension Sister   . Hypertension Sister    Social History:  reports that she has been smoking.  She does not have any smokeless tobacco history on file. She reports that she does not drink alcohol or use illicit drugs.  Allergies: No Known Allergies  Medications Prior to Admission  Medication Sig Dispense Refill  . buPROPion (WELLBUTRIN XL) 300 MG 24 hr tablet Take 1 tablet (300 mg total) by mouth daily. 30 tablet 5  . losartan-hydrochlorothiazide (HYZAAR) 100-25 MG per tablet TAKE 1 TABLET BY MOUTH DAILY. 90 tablet 1  . simvastatin (ZOCOR) 40 MG tablet Take 1 tablet (40 mg total) by mouth at bedtime. 90 tablet 1  . aspirin (ASPIRIN LOW DOSE) 81 MG EC tablet Take 81 mg by mouth daily.        No results found for this or any previous visit (from the past 48 hour(s)). No results found.  ROS  Blood pressure 147/68, pulse 78, temperature 97.4 F (36.3 C), temperature source Oral, resp. rate 16, SpO2 99 %. Physical Exam  Constitutional: She appears well-developed and well-nourished.  HENT:  Mouth/Throat: Oropharynx is clear and moist.  Eyes: Conjunctivae are normal. No scleral icterus.  Neck: No thyromegaly present.  Cardiovascular:  Normal rate, regular rhythm and normal heart sounds.   No murmur heard. Respiratory: Effort normal and breath sounds normal.  GI: Soft. She exhibits no distension and no mass. There is no tenderness.  Musculoskeletal: She exhibits no edema.  Lymphadenopathy:    She has no cervical adenopathy.  Neurological: She is alert.  Skin: Skin is warm and dry.     Assessment/Plan Average risk screening colonoscopy.  Jacinda Kanady U 02/20/2015, 9:35 AM

## 2015-02-20 NOTE — Discharge Instructions (Signed)
No aspirin for 1 week. Resume usual medications and diet. No driving for 24 hours. Physician will call with biopsy results.  Colonoscopy, Care After Refer to this sheet in the next few weeks. These instructions provide you with information on caring for yourself after your procedure. Your health care provider may also give you more specific instructions. Your treatment has been planned according to current medical practices, but problems sometimes occur. Call your health care provider if you have any problems or questions after your procedure. WHAT TO EXPECT AFTER THE PROCEDURE  After your procedure, it is typical to have the following:  A small amount of blood in your stool.  Moderate amounts of gas and mild abdominal cramping or bloating. HOME CARE INSTRUCTIONS  Do not drive, operate machinery, or sign important documents for 24 hours.  You may shower and resume your regular physical activities, but move at a slower pace for the first 24 hours.  Take frequent rest periods for the first 24 hours.  Walk around or put a warm pack on your abdomen to help reduce abdominal cramping and bloating.  Drink enough fluids to keep your urine clear or pale yellow.  You may resume your normal diet as instructed by your health care provider. Avoid heavy or fried foods that are hard to digest.  Avoid drinking alcohol for 24 hours or as instructed by your health care provider.  Only take over-the-counter or prescription medicines as directed by your health care provider.  If a tissue sample (biopsy) was taken during your procedure:  Do not take aspirin or blood thinners for 7 days, or as instructed by your health care provider.  Do not drink alcohol for 7 days, or as instructed by your health care provider.  Eat soft foods for the first 24 hours. SEEK MEDICAL CARE IF: You have persistent spotting of blood in your stool 2-3 days after the procedure. SEEK IMMEDIATE MEDICAL CARE IF:  You have  more than a small spotting of blood in your stool.  You pass large blood clots in your stool.  Your abdomen is swollen (distended).  You have nausea or vomiting.  You have a fever.  You have increasing abdominal pain that is not relieved with medicine. Document Released: 04/20/2004 Document Revised: 06/27/2013 Document Reviewed: 05/14/2013 [facility] Patient Information 2015 [place], [place]. This information is not intended to replace advice given to you by your health care provider. Make sure you discuss any questions you have with your health care provider.

## 2015-02-21 ENCOUNTER — Encounter (HOSPITAL_COMMUNITY): Payer: Self-pay | Admitting: Internal Medicine

## 2015-02-24 ENCOUNTER — Encounter (INDEPENDENT_AMBULATORY_CARE_PROVIDER_SITE_OTHER): Payer: Self-pay | Admitting: *Deleted

## 2015-02-26 ENCOUNTER — Encounter: Payer: Self-pay | Admitting: Family Medicine

## 2015-04-11 ENCOUNTER — Other Ambulatory Visit: Payer: Self-pay | Admitting: Family Medicine

## 2015-04-28 ENCOUNTER — Other Ambulatory Visit: Payer: Self-pay | Admitting: Family Medicine

## 2015-05-21 ENCOUNTER — Encounter: Payer: Commercial Managed Care - HMO | Admitting: Family Medicine

## 2015-07-02 ENCOUNTER — Other Ambulatory Visit: Payer: Self-pay | Admitting: Family Medicine

## 2015-07-02 DIAGNOSIS — I10 Essential (primary) hypertension: Secondary | ICD-10-CM | POA: Diagnosis not present

## 2015-07-02 DIAGNOSIS — E559 Vitamin D deficiency, unspecified: Secondary | ICD-10-CM | POA: Diagnosis not present

## 2015-07-02 DIAGNOSIS — E785 Hyperlipidemia, unspecified: Secondary | ICD-10-CM | POA: Diagnosis not present

## 2015-07-02 DIAGNOSIS — Z1159 Encounter for screening for other viral diseases: Secondary | ICD-10-CM | POA: Diagnosis not present

## 2015-07-02 DIAGNOSIS — R7302 Impaired glucose tolerance (oral): Secondary | ICD-10-CM | POA: Diagnosis not present

## 2015-07-02 LAB — CBC WITH DIFFERENTIAL/PLATELET
Basophils Absolute: 0.1 10*3/uL (ref 0.0–0.1)
Basophils Relative: 1 % (ref 0–1)
EOS ABS: 0.2 10*3/uL (ref 0.0–0.7)
EOS PCT: 3 % (ref 0–5)
HCT: 38.8 % (ref 36.0–46.0)
Hemoglobin: 12.6 g/dL (ref 12.0–15.0)
LYMPHS ABS: 2.3 10*3/uL (ref 0.7–4.0)
LYMPHS PCT: 30 % (ref 12–46)
MCH: 28.6 pg (ref 26.0–34.0)
MCHC: 32.5 g/dL (ref 30.0–36.0)
MCV: 88.2 fL (ref 78.0–100.0)
MONO ABS: 0.5 10*3/uL (ref 0.1–1.0)
MONOS PCT: 6 % (ref 3–12)
MPV: 10.8 fL (ref 8.6–12.4)
Neutro Abs: 4.7 10*3/uL (ref 1.7–7.7)
Neutrophils Relative %: 60 % (ref 43–77)
PLATELETS: 305 10*3/uL (ref 150–400)
RBC: 4.4 MIL/uL (ref 3.87–5.11)
RDW: 13.3 % (ref 11.5–15.5)
WBC: 7.8 10*3/uL (ref 4.0–10.5)

## 2015-07-03 LAB — COMPREHENSIVE METABOLIC PANEL
ALT: 17 U/L (ref 6–29)
AST: 19 U/L (ref 10–35)
Albumin: 3.9 g/dL (ref 3.6–5.1)
Alkaline Phosphatase: 84 U/L (ref 33–130)
BILIRUBIN TOTAL: 0.6 mg/dL (ref 0.2–1.2)
BUN: 12 mg/dL (ref 7–25)
CHLORIDE: 103 mmol/L (ref 98–110)
CO2: 31 mmol/L (ref 20–31)
CREATININE: 0.7 mg/dL (ref 0.50–0.99)
Calcium: 9.1 mg/dL (ref 8.6–10.4)
GLUCOSE: 88 mg/dL (ref 65–99)
Potassium: 4.4 mmol/L (ref 3.5–5.3)
SODIUM: 141 mmol/L (ref 135–146)
Total Protein: 7 g/dL (ref 6.1–8.1)

## 2015-07-03 LAB — HEMOGLOBIN A1C
Hgb A1c MFr Bld: 6.4 % — ABNORMAL HIGH (ref <5.7)
MEAN PLASMA GLUCOSE: 137 mg/dL — AB (ref <117)

## 2015-07-03 LAB — LIPID PANEL
CHOL/HDL RATIO: 3.1 ratio (ref <=5.0)
Cholesterol: 157 mg/dL (ref 125–200)
HDL: 51 mg/dL (ref >=46)
LDL CALC: 93 mg/dL (ref <130)
Triglycerides: 66 mg/dL (ref <150)
VLDL: 13 mg/dL (ref <30)

## 2015-07-03 LAB — VITAMIN D 25 HYDROXY (VIT D DEFICIENCY, FRACTURES): Vit D, 25-Hydroxy: 26 ng/mL — ABNORMAL LOW (ref 30–100)

## 2015-07-07 LAB — HEPATITIS C ANTIBODY: HCV AB: NEGATIVE

## 2015-07-22 ENCOUNTER — Encounter: Payer: Self-pay | Admitting: Family Medicine

## 2015-07-22 ENCOUNTER — Ambulatory Visit (INDEPENDENT_AMBULATORY_CARE_PROVIDER_SITE_OTHER): Payer: Commercial Managed Care - HMO | Admitting: Family Medicine

## 2015-07-22 VITALS — BP 130/78 | HR 89 | Resp 16 | Ht 66.0 in | Wt 205.4 lb

## 2015-07-22 DIAGNOSIS — Z0000 Encounter for general adult medical examination without abnormal findings: Secondary | ICD-10-CM

## 2015-07-22 DIAGNOSIS — R7303 Prediabetes: Secondary | ICD-10-CM

## 2015-07-22 DIAGNOSIS — Z1211 Encounter for screening for malignant neoplasm of colon: Secondary | ICD-10-CM

## 2015-07-22 DIAGNOSIS — Z23 Encounter for immunization: Secondary | ICD-10-CM

## 2015-07-22 DIAGNOSIS — D126 Benign neoplasm of colon, unspecified: Secondary | ICD-10-CM | POA: Insufficient documentation

## 2015-07-22 DIAGNOSIS — R7302 Impaired glucose tolerance (oral): Secondary | ICD-10-CM

## 2015-07-22 DIAGNOSIS — F17208 Nicotine dependence, unspecified, with other nicotine-induced disorders: Secondary | ICD-10-CM

## 2015-07-22 LAB — POC HEMOCCULT BLD/STL (OFFICE/1-CARD/DIAGNOSTIC): Fecal Occult Blood, POC: NEGATIVE

## 2015-07-22 MED ORDER — METFORMIN HCL ER 500 MG PO TB24: 500.0000 mg | ORAL_TABLET | Freq: Every day | ORAL | Status: DC

## 2015-07-22 NOTE — Patient Instructions (Addendum)
F/u in 4 month, call if you need me before  Flu vaccine today  Metformin 500 mg one daily is new tio help with blood sugar control, you need to change eating and nurse will discuss in more detail today, you are neare to becoming diabetic if you do not do this   Quit date for cigarettes in 07/29/2015, now at 2 per day, need to [place]!  All the best for 2017!  Please work on good  health habits so that your health will improve. 1. Commitment to daily physical activity for 30 to 60  minutes, if you are able to do this.  2. Commitment to wise food choices. Aim for half of your  food intake to be vegetable and fruit, one quarter starchy foods, and one quarter protein. Try to eat on a regular schedule  3 meals per day, snacking between meals should be limited to vegetables or fruits or small portions of nuts. 64 ounces of water per day is generally recommended, unless you have specific health conditions, like heart failure or kidney failure where you will need to limit fluid intake.  3. Commitment to sufficient and a  good quality of physical and mental rest daily, generally between 6 to 8 hours per day.  WITH PERSISTANCE AND PERSEVERANCE, THE IMPOSSIBLE , BECOMES THE NORM!  Thanks for choosing [facility], we consider it a privelige to serve you.   HJBA1C, chem 7 and EGFR and TSH in 4 month  Start once daily vit D3 800 iU your level is low, this helps muscles and potentially bones

## 2015-07-22 NOTE — Progress Notes (Signed)
Subjective:    Patient ID: Amanda Simon, female    DOB: 1951-07-11, 64 y.o.   MRN: [id number]  HPI Patient is in for annual physical exam. No other health concerns are expressed  at the visit. Recent labs,  are reviewed.Blood sugar has deteriorated, she is to start daily metformin, and is educated during visit re low carb diet, and need to commit to daily exercise and weight loss Immunization is reviewed , and  updated .  ROS:  See HPI  PE:  BP 130/78 mmHg  Pulse 89  Resp 16  Ht 5\' 6"  (1.676 m)  Wt 205 lb 6.4 oz (93.169 kg)  BMI 33.17 kg/m2  SpO2 99%  Pleasant well nourished female, alert and oriented x 3, in no cardio-pulmonary distress. Afebrile. HEENT No facial trauma or asymetry. Sinuses non tender.  Extra occullar muscles intact, pupils equally reactive to light. External ears normal, tympanic membranes clear. Oropharynx moist, no exudate, fairly  good dentition. Neck: supple, no adenopathy,JVD or thyromegaly.No bruits.  Chest: Clear to ascultation bilaterally.No crackles or wheezes.Decreased though adequate air entry Non tender to palpation  Breast: No asymetry,no masses or lumps. No tenderness. No nipple discharge or inversion. No axillary or supraclavicular adenopathy  Cardiovascular system; Heart sounds normal,  S1 and  S2 ,no S3.  No murmur, or thrill. Apical beat not displaced Peripheral pulses normal.  Abdomen: Soft, non tender, no organomegaly or masses. No bruits. Bowel sounds normal. No guarding, tenderness or rebound.  Rectal:  Normal sphincter tone. No mass.No rectal masses.  Guaiac negative stool.  GU: External genitalia normal female genitalia , female distribution of hair. No lesions. Urethral meatus normal in size, no  Prolapse, no lesions visibly  Present. Bladder non tender. Vagina pink and moist , with no visible lesions , discharge present . Adequate pelvic support no  cystocele or rectocele noted  Uterus absent no adnexal  masses, no  adnexal tenderness.   Musculoskeletal exam: Full ROM of spine, hips , shoulders and knees. No deformity ,swelling or crepitus noted. No muscle wasting or atrophy.   Neurologic: Cranial nerves 2 to 12 intact. Power, tone ,sensation and reflexes normal throughout. No disturbance in gait. No tremor.  Skin: Intact, no ulceration, erythema , scaling or rash noted. Pigmentation normal throughout  Psych; Normal mood and affect. Judgement and concentration normal        Assessment:    Annual physical exam Annual exam as documented. Counseling done  re healthy lifestyle involving commitment to 150 minutes exercise per week, heart healthy diet, and attaining healthy weight.The importance of adequate sleep also discussed. Regular seat belt use and home safety, is also discussed. Changes in health habits are decided on by the patient with goals and time frames  set for achieving them. Immunization and cancer screening needs are specifically addressed at this visit.   IGT (impaired glucose tolerance) Deteriorated Patient educated about the importance of limiting  Carbohydrate intake , the need to commit to daily physical activity for a minimum of 30 minutes , and to commit weight loss. The fact that changes in all these areas will reduce or eliminate all together the development of diabetes is stressed.  Start daily metformin  Diabetic Labs Latest Ref Rng 07/02/2015 01/25/2015 07/30/2014 02/22/2014 09/25/2013  HbA1c <5.7 % 6.4(H) - 6.1(H) 6.2(H) 6.2(H)  Chol 125 - 200 mg/dL 157 164 169 164 172  HDL >=46 mg/dL 51 47 53 48 53  Calc LDL <130 mg/dL 93 97 100(H) 104(H) 101(H)  Triglycerides <150 mg/dL 66 101 81 58 91  Creatinine 0.50 - 0.99 mg/dL 0.70 0.75 0.77 0.70 0.75   BP/Weight 07/22/2015 02/20/2015 02/05/2015 09/24/2014 02/28/2014 10/24/2013 01/14/622  Systolic BP 762 831 517 616 073 710 626  Diastolic BP 78 71 78 80 82 70 80  Wt. (Lbs) 205.4 - 205 203 197 193.8 197  BMI 33.17  - 33.58 34.83 33.8 33.25 33.8   No flowsheet data found.     Nicotine dependence Patient counseled for approximately 5 minutes regarding the health risks of ongoing nicotine use, specifically all types of cancer, heart disease, stroke and respiratory failure. The options available for help with cessation ,the behavioral changes to assist the process, and the option to either gradully reduce usage  Or abruptly stop.is also discussed. Pt is also encouraged to set specific goals in number of cigarettes used daily, as well as to set a quit date.  Number of cigarettes/cigars currently smoking daily: 2      Plan:       Review of Systems     Objective:   Physical Exam        Assessment & Plan:

## 2015-07-27 NOTE — Assessment & Plan Note (Signed)

## 2015-07-27 NOTE — Assessment & Plan Note (Signed)

## 2015-07-27 NOTE — Assessment & Plan Note (Signed)
Amanda Simon educated about the importance of limiting  Carbohydrate intake , the need to commit to daily physical activity for a minimum of 30 minutes , and to commit weight loss. The fact that changes in all these areas will reduce or eliminate all together the development of diabetes is stressed.  Start daily metformin  Diabetic Labs Latest Ref Rng 07/02/2015 01/25/2015 07/30/2014 02/22/2014 09/25/2013  HbA1c <5.7 % 6.4(H) - 6.1(H) 6.2(H) 6.2(H)  Chol 125 - 200 mg/dL 157 164 169 164 172  HDL >=46 mg/dL 51 47 53 48 53  Calc LDL <130 mg/dL 93 97 100(H) 104(H) 101(H)  Triglycerides <150 mg/dL 66 101 81 58 91  Creatinine 0.50 - 0.99 mg/dL 0.70 0.75 0.77 0.70 0.75   BP/Weight 07/22/2015 02/20/2015 02/05/2015 09/24/2014 02/28/2014 10/24/2013 12/27/8117  Systolic BP 147 829 562 130 865 784 696  Diastolic BP 78 71 78 80 82 70 80  Wt. (Lbs) 205.4 - 205 203 197 193.8 197  BMI 33.17 - 33.58 34.83 33.8 33.25 33.8   No flowsheet data found.

## 2015-07-27 NOTE — Assessment & Plan Note (Signed)
After obtaining informed consent, the vaccine is  administered by LPN.  

## 2015-08-23 ENCOUNTER — Other Ambulatory Visit: Payer: Self-pay | Admitting: Family Medicine

## 2015-11-13 DIAGNOSIS — R7303 Prediabetes: Secondary | ICD-10-CM | POA: Diagnosis not present

## 2015-11-13 DIAGNOSIS — I10 Essential (primary) hypertension: Secondary | ICD-10-CM | POA: Diagnosis not present

## 2015-11-13 DIAGNOSIS — R7309 Other abnormal glucose: Secondary | ICD-10-CM | POA: Diagnosis not present

## 2015-11-14 LAB — COMPLETE METABOLIC PANEL WITH GFR
ALBUMIN: 3.9 g/dL (ref 3.6–5.1)
ALT: 13 U/L (ref 6–29)
AST: 14 U/L (ref 10–35)
Alkaline Phosphatase: 92 U/L (ref 33–130)
BILIRUBIN TOTAL: 0.7 mg/dL (ref 0.2–1.2)
BUN: 12 mg/dL (ref 7–25)
CO2: 28 mmol/L (ref 20–31)
CREATININE: 0.63 mg/dL (ref 0.50–0.99)
Calcium: 8.9 mg/dL (ref 8.6–10.4)
Chloride: 103 mmol/L (ref 98–110)
GFR, Est African American: >89 mL/min (ref >=60)
GFR, Est Non African American: >89 mL/min (ref >=60)
Glucose, Bld: 91 mg/dL (ref 65–99)
Potassium: 3.8 mmol/L (ref 3.5–5.3)
Sodium: 139 mmol/L (ref 135–146)
TOTAL PROTEIN: 7.2 g/dL (ref 6.1–8.1)

## 2015-11-14 LAB — TSH: TSH: 1.02 mIU/L

## 2015-11-14 LAB — HEMOGLOBIN A1C
Hgb A1c MFr Bld: 6 % — ABNORMAL HIGH (ref <5.7)
Mean Plasma Glucose: 126 mg/dL — ABNORMAL HIGH (ref <117)

## 2015-11-19 ENCOUNTER — Ambulatory Visit (INDEPENDENT_AMBULATORY_CARE_PROVIDER_SITE_OTHER): Payer: Commercial Managed Care - HMO | Admitting: Family Medicine

## 2015-11-19 ENCOUNTER — Encounter: Payer: Self-pay | Admitting: Family Medicine

## 2015-11-19 VITALS — BP 124/80 | HR 80 | Resp 16 | Ht 66.0 in | Wt 202.8 lb

## 2015-11-19 DIAGNOSIS — R7302 Impaired glucose tolerance (oral): Secondary | ICD-10-CM | POA: Diagnosis not present

## 2015-11-19 DIAGNOSIS — Z1239 Encounter for other screening for malignant neoplasm of breast: Secondary | ICD-10-CM | POA: Diagnosis not present

## 2015-11-19 DIAGNOSIS — F418 Other specified anxiety disorders: Secondary | ICD-10-CM

## 2015-11-19 DIAGNOSIS — E669 Obesity, unspecified: Secondary | ICD-10-CM

## 2015-11-19 DIAGNOSIS — E785 Hyperlipidemia, unspecified: Secondary | ICD-10-CM

## 2015-11-19 DIAGNOSIS — I10 Essential (primary) hypertension: Secondary | ICD-10-CM

## 2015-11-19 DIAGNOSIS — F17218 Nicotine dependence, cigarettes, with other nicotine-induced disorders: Secondary | ICD-10-CM

## 2015-11-19 NOTE — Progress Notes (Signed)
Subjective:    Patient ID: Amanda Simon, female    DOB: [birth year], 65 y.o.   MRN: [id number]  HPI   Amanda Simon     MRN: [id number]      DOB: 02-Sep-1951   HPI Ms. Mole is here for follow up and re-evaluation of chronic medical conditions, medication management and review of any available recent lab and radiology data.  Preventive health is updated, specifically  Cancer screening and Immunization.    The PT reports leg  Cramps with metformin, since not diabetic , I recommend she discontinue and work on dietary change.  There are no new concerns.  There are no specific complaints   ROS Denies recent fever or chills. Denies sinus pressure, nasal congestion, ear pain or sore throat. Denies chest congestion, productive cough or wheezing. Denies chest pains, palpitations and leg swelling Denies abdominal pain, nausea, vomiting,diarrhea or constipation.   Denies dysuria, frequency, hesitancy or incontinence. Denies joint pain, swelling and limitation in mobility. Denies headaches, seizures, numbness, or tingling. Denies depression, anxiety or insomnia. Denies skin break down or rash.   PE  BP 124/80 mmHg  Pulse 80  Resp 16  Ht 5\' 6"  (1.676 m)  Wt 202 lb 12.8 oz (91.989 kg)  BMI 32.75 kg/m2  SpO2 96%  Patient alert and oriented and in no cardiopulmonary distress.  HEENT: No facial asymmetry, EOMI,   oropharynx pink and moist.  Neck supple no JVD, no mass.  Chest: Clear to auscultation bilaterally.  CVS: S1, S2 no murmurs, no S3.Regular rate.  ABD: Soft non tender.   Ext: No edema  MS: Adequate ROM spine, shoulders, hips and knees.  Skin: Intact, no ulcerations or rash noted.  Psych: Good eye contact, normal affect. Memory intact not anxious or depressed appearing.  CNS: CN 2-12 intact, power,  normal throughout.no focal deficits noted.   Assessment & Plan   Essential hypertension Controlled, no change in medication DASH diet and commitment to  daily physical activity for a minimum of 30 minutes discussed and encouraged, as a part of hypertension management. The importance of attaining a healthy weight is also discussed.  BP/Weight 11/19/2015 07/22/2015 02/20/2015 02/05/2015 09/24/2014 [id number]  Systolic BP [id number] 123456 123456 123456 123456  Diastolic BP 80 78 71 78 80 82 70  Wt. (Lbs) 202.8 205.4 - 205 203 197 193.8  BMI 32.75 33.17 - 33.58 34.83 33.8 33.25        Nicotine dependence Patient counseled for approximately 5 minutes regarding the health risks of ongoing nicotine use, specifically all types of cancer, heart disease, stroke and respiratory failure. The options available for help with cessation ,the behavioral changes to assist the process, and the option to either gradully reduce usage  Or abruptly stop.is also discussed. Pt is also encouraged to set specific goals in number of cigarettes used daily, as well as to set a quit date.  Number of cigarettes/cigars currently smoking daily:  Two per week   IGT (impaired glucose tolerance) Improved with metformin, however, will d/c same due to intolerance Patient educated about the importance of limiting  Carbohydrate intake , the need to commit to daily physical activity for a minimum of 30 minutes , and to commit weight loss. The fact that changes in all these areas will reduce or eliminate all together the development of diabetes is stressed.   Diabetic Labs Latest Ref Rng 11/13/2015 07/02/2015 01/25/2015 07/30/2014 02/22/2014  HbA1c <5.7 % 6.0(H) 6.4(H) - 6.1(H) 6.2(H)  Chol 125 - 200 mg/dL - 157 164 169 164  HDL >=46 mg/dL - 51 47 53 48  Calc LDL <130 mg/dL - 93 97 100(H) 104(H)  Triglycerides <150 mg/dL - 66 101 81 58  Creatinine 0.50 - 0.99 mg/dL 0.63 0.70 0.75 0.77 0.70   BP/Weight 11/19/2015 07/22/2015 02/20/2015 02/05/2015 09/24/2014 123456 [id number]  Systolic BP [id number] 123456 123456 123456 123456  Diastolic BP 80 78 71 78 80 82 70  Wt. (Lbs) 202.8 205.4 - 205 203 197 193.8    BMI 32.75 33.17 - 33.58 34.83 33.8 33.25   No flowsheet data found.     Depression with anxiety Resolved without medicaiton, her daughter's health has improved tremendously  Hyperlipemia Controlled, no change in medication Hyperlipidemia:Low fat diet discussed and encouraged.   Lipid Panel  Lab Results  Component Value Date   CHOL 157 07/02/2015   HDL 51 07/02/2015   LDLCALC 93 07/02/2015   TRIG 66 07/02/2015   CHOLHDL 3.1 07/02/2015      \Will rept in October  Obesity (BMI 30.0-34.9) Improved Patient re-educated about  the importance of commitment to a  minimum of 150 minutes of exercise per week.  The importance of healthy food choices with portion control discussed. Encouraged to start a food diary, count calories and to consider  joining a support group. Sample diet sheets offered. Goals set by the patient for the next several months.   Weight /BMI 11/19/2015 07/22/2015 02/05/2015  WEIGHT 202 lb 12.8 oz 205 lb 6.4 oz 205 lb  HEIGHT 5\' 6"  5\' 6"  5' 5.5"  BMI 32.75 kg/m2 33.17 kg/m2 33.58 kg/m2    Current exercise per week 90 minutes.        Review of Systems     Objective:   Physical Exam        Assessment & Plan:

## 2015-11-19 NOTE — Assessment & Plan Note (Signed)
Controlled, no change in medication Hyperlipidemia:Low fat diet discussed and encouraged.   Lipid Panel  Lab Results  Component Value Date   CHOL 157 07/02/2015   HDL 51 07/02/2015   LDLCALC 93 07/02/2015   TRIG 66 07/02/2015   CHOLHDL 3.1 07/02/2015      \Will rept in October

## 2015-11-19 NOTE — Assessment & Plan Note (Signed)
Resolved without medicaiton, her daughter's health has improved tremendously

## 2015-11-19 NOTE — Patient Instructions (Addendum)
Annual wellness in 4 month, call if you need me before  STOP metformin, and continue to avoid sweet except as fruit Commit to daily exercise  PLEASE plan top stop smoking  Mammogram due May 17,we will sched  Thanks for choosing [facility], we consider it a privelige to serve you.

## 2015-11-19 NOTE — Assessment & Plan Note (Signed)

## 2015-11-19 NOTE — Assessment & Plan Note (Signed)
Improved with metformin, however, will d/c same due to intolerance Patient educated about the importance of limiting  Carbohydrate intake , the need to commit to daily physical activity for a minimum of 30 minutes , and to commit weight loss. The fact that changes in all these areas will reduce or eliminate all together the development of diabetes is stressed.   Diabetic Labs Latest Ref Rng 11/13/2015 07/02/2015 01/25/2015 07/30/2014 02/22/2014  HbA1c <5.7 % 6.0(H) 6.4(H) - 6.1(H) 6.2(H)  Chol 125 - 200 mg/dL - 157 164 169 164  HDL >=46 mg/dL - 51 47 53 48  Calc LDL <130 mg/dL - 93 97 100(H) 104(H)  Triglycerides <150 mg/dL - 66 101 81 58  Creatinine 0.50 - 0.99 mg/dL 0.63 0.70 0.75 0.77 0.70   BP/Weight 11/19/2015 07/22/2015 02/20/2015 02/05/2015 09/24/2014 123456 [id number]  Systolic BP [id number] 123456 123456 123456 123456  Diastolic BP 80 78 71 78 80 82 70  Wt. (Lbs) 202.8 205.4 - 205 203 197 193.8  BMI 32.75 33.17 - 33.58 34.83 33.8 33.25   No flowsheet data found.

## 2015-11-19 NOTE — Assessment & Plan Note (Signed)
Improved Patient re-educated about  the importance of commitment to a  minimum of 150 minutes of exercise per week.  The importance of healthy food choices with portion control discussed. Encouraged to start a food diary, count calories and to consider  joining a support group. Sample diet sheets offered. Goals set by the patient for the next several months.   Weight /BMI 11/19/2015 07/22/2015 02/05/2015  WEIGHT 202 lb 12.8 oz 205 lb 6.4 oz 205 lb  HEIGHT 5\' 6"  5\' 6"  5' 5.5"  BMI 32.75 kg/m2 33.17 kg/m2 33.58 kg/m2    Current exercise per week 90 minutes.

## 2015-11-19 NOTE — Assessment & Plan Note (Signed)
Controlled, no change in medication DASH diet and commitment to daily physical activity for a minimum of 30 minutes discussed and encouraged, as a part of hypertension management. The importance of attaining a healthy weight is also discussed.  BP/Weight 11/19/2015 07/22/2015 02/20/2015 02/05/2015 09/24/2014 123456 [id number]  Systolic BP [id number] 123456 123456 123456 123456  Diastolic BP 80 78 71 78 80 82 70  Wt. (Lbs) 202.8 205.4 - 205 203 197 193.8  BMI 32.75 33.17 - 33.58 34.83 33.8 33.25

## 2015-11-24 ENCOUNTER — Other Ambulatory Visit: Payer: Self-pay | Admitting: Family Medicine

## 2015-12-29 ENCOUNTER — Other Ambulatory Visit: Payer: Self-pay | Admitting: Family Medicine

## 2016-02-09 ENCOUNTER — Ambulatory Visit (HOSPITAL_COMMUNITY): Payer: Commercial Managed Care - HMO

## 2016-02-12 ENCOUNTER — Other Ambulatory Visit: Payer: Self-pay | Admitting: Family Medicine

## 2016-02-17 ENCOUNTER — Other Ambulatory Visit: Payer: Self-pay | Admitting: Family Medicine

## 2016-02-18 ENCOUNTER — Ambulatory Visit (HOSPITAL_COMMUNITY)
Admission: RE | Admit: 2016-02-18 | Discharge: 2016-02-18 | Disposition: A | Discharge status: Home / Self Care | Payer: Commercial Managed Care - HMO | Source: Ambulatory Visit | Attending: Family Medicine | Admitting: Family Medicine

## 2016-02-18 DIAGNOSIS — Z1231 Encounter for screening mammogram for malignant neoplasm of breast: Secondary | ICD-10-CM | POA: Diagnosis not present

## 2016-02-18 DIAGNOSIS — Z1239 Encounter for other screening for malignant neoplasm of breast: Secondary | ICD-10-CM | POA: Diagnosis not present

## 2016-02-28 ENCOUNTER — Other Ambulatory Visit: Payer: Self-pay | Admitting: Family Medicine

## 2016-04-05 ENCOUNTER — Encounter: Payer: Commercial Managed Care - HMO | Admitting: Family Medicine

## 2016-07-09 ENCOUNTER — Other Ambulatory Visit: Payer: Self-pay | Admitting: Family Medicine

## 2016-08-06 ENCOUNTER — Telehealth: Payer: Self-pay

## 2016-08-06 DIAGNOSIS — E559 Vitamin D deficiency, unspecified: Secondary | ICD-10-CM

## 2016-08-06 DIAGNOSIS — I10 Essential (primary) hypertension: Secondary | ICD-10-CM

## 2016-08-06 DIAGNOSIS — E785 Hyperlipidemia, unspecified: Secondary | ICD-10-CM

## 2016-08-06 DIAGNOSIS — R7302 Impaired glucose tolerance (oral): Secondary | ICD-10-CM

## 2016-08-06 NOTE — Telephone Encounter (Signed)
Lab order sent

## 2016-08-16 DIAGNOSIS — E785 Hyperlipidemia, unspecified: Secondary | ICD-10-CM | POA: Diagnosis not present

## 2016-08-16 DIAGNOSIS — I10 Essential (primary) hypertension: Secondary | ICD-10-CM | POA: Diagnosis not present

## 2016-08-16 DIAGNOSIS — R7302 Impaired glucose tolerance (oral): Secondary | ICD-10-CM | POA: Diagnosis not present

## 2016-08-16 DIAGNOSIS — E559 Vitamin D deficiency, unspecified: Secondary | ICD-10-CM | POA: Diagnosis not present

## 2016-08-16 LAB — HEMOGLOBIN A1C
Hgb A1c MFr Bld: 5.8 % — ABNORMAL HIGH (ref <5.7)
Mean Plasma Glucose: 120 mg/dL

## 2016-08-16 LAB — COMPLETE METABOLIC PANEL WITH GFR
ALK PHOS: 73 U/L (ref 33–130)
ALT: 13 U/L (ref 6–29)
AST: 14 U/L (ref 10–35)
Albumin: 4.2 g/dL (ref 3.6–5.1)
BUN: 14 mg/dL (ref 7–25)
CO2: 29 mmol/L (ref 20–31)
Calcium: 9.4 mg/dL (ref 8.6–10.4)
Chloride: 104 mmol/L (ref 98–110)
Creat: 0.73 mg/dL (ref 0.50–0.99)
GFR, Est Non African American: 85 mL/min (ref >=60)
GLUCOSE: 104 mg/dL — AB (ref 65–99)
POTASSIUM: 4 mmol/L (ref 3.5–5.3)
SODIUM: 139 mmol/L (ref 135–146)
Total Bilirubin: 0.7 mg/dL (ref 0.2–1.2)
Total Protein: 7.5 g/dL (ref 6.1–8.1)

## 2016-08-16 LAB — CBC
HEMATOCRIT: 41.5 % (ref 35.0–45.0)
HEMOGLOBIN: 13.5 g/dL (ref 11.7–15.5)
MCH: 29.2 pg (ref 27.0–33.0)
MCHC: 32.5 g/dL (ref 32.0–36.0)
MCV: 89.6 fL (ref 80.0–100.0)
MPV: 10.5 fL (ref 7.5–12.5)
Platelets: 302 10*3/uL (ref 140–400)
RBC: 4.63 MIL/uL (ref 3.80–5.10)
RDW: 13.6 % (ref 11.0–15.0)
WBC: 7.6 10*3/uL (ref 3.8–10.8)

## 2016-08-16 LAB — LIPID PANEL
CHOLESTEROL: 166 mg/dL (ref <200)
HDL: 50 mg/dL — ABNORMAL LOW (ref >50)
LDL Cholesterol: 104 mg/dL — ABNORMAL HIGH (ref <100)
Total CHOL/HDL Ratio: 3.3 Ratio (ref <5.0)
Triglycerides: 62 mg/dL (ref <150)
VLDL: 12 mg/dL (ref <30)

## 2016-08-17 LAB — VITAMIN D 25 HYDROXY (VIT D DEFICIENCY, FRACTURES): Vit D, 25-Hydroxy: 21 ng/mL — ABNORMAL LOW (ref 30–100)

## 2016-08-19 ENCOUNTER — Ambulatory Visit (INDEPENDENT_AMBULATORY_CARE_PROVIDER_SITE_OTHER): Payer: Commercial Managed Care - HMO

## 2016-08-19 VITALS — BP 142/78 | HR 86 | Resp 18 | Ht 66.0 in | Wt 200.0 lb

## 2016-08-19 DIAGNOSIS — Z0000 Encounter for general adult medical examination without abnormal findings: Secondary | ICD-10-CM | POA: Diagnosis not present

## 2016-08-19 DIAGNOSIS — Z23 Encounter for immunization: Secondary | ICD-10-CM

## 2016-08-19 NOTE — Patient Instructions (Signed)
Thank you for choosing [facility] for your health care needs  The Annual Wellness Visit is designed to allow [place] the chance to assist you in preserving and improving you health.   Dr. Moshe Cipro will see you back in 6 months,  If you need to be seen before please contact the office  Any labs needed before your next visit will be mailed to you  I will be in contact about the xray for your shoulder

## 2016-08-20 ENCOUNTER — Telehealth: Payer: Self-pay

## 2016-08-20 DIAGNOSIS — E559 Vitamin D deficiency, unspecified: Secondary | ICD-10-CM

## 2016-08-20 DIAGNOSIS — I10 Essential (primary) hypertension: Secondary | ICD-10-CM

## 2016-08-20 DIAGNOSIS — R7301 Impaired fasting glucose: Secondary | ICD-10-CM

## 2016-08-20 DIAGNOSIS — E782 Mixed hyperlipidemia: Secondary | ICD-10-CM

## 2016-08-20 NOTE — Telephone Encounter (Signed)
More likely a tendon injury than a fracture, tendon will not show on X ray, pls explain, if having difficulty or pain with the shoulder I recommend 1 week course of ibuprofen 800 mg twice daily and pred 5 mg twice daily for 5 days first, if not better best to have ortho eval, recommend start with anti inflam and call back. Also I am sending result note of recent labs to you to discuss at same time wit her, thanks

## 2016-08-20 NOTE — Progress Notes (Signed)
Subjective:    Amanda Simon is a 65 y.o. female who presents for Medicare Annual/Subsequent preventive examination.  Preventive Screening-Counseling & Management  Tobacco History  Smoking Status  . Current Some Day Smoker  Smokeless Tobacco  . Never Used    Comment: 2 cigarettes since April 1     Current Problems (verified) Patient Active Problem List   Diagnosis Date Noted  . Vitamin D deficiency 08/06/2016  . Tubular adenoma of colon 07/22/2015  . Depression with anxiety 02/05/2015  . Nicotine dependence 03/28/2012  . IGT (impaired glucose tolerance) 12/05/2011  . Hyperlipemia 01/02/2008  . Obesity (BMI 30.0-34.9) 01/02/2008  . Essential hypertension 01/02/2008  . OSTEOPENIA 01/02/2008    Medications Prior to Visit Current Outpatient Prescriptions on File Prior to Visit  Medication Sig Dispense Refill  . aspirin 81 MG chewable tablet Chew 81 mg by mouth daily.    Marland Kitchen losartan-hydrochlorothiazide (HYZAAR) 100-25 MG tablet TAKE 1 TABLET BY MOUTH DAILY. 90 tablet 1  . simvastatin (ZOCOR) 40 MG tablet TAKE 1 TABLET (40 MG TOTAL) BY MOUTH AT BEDTIME. 90 tablet 1   No current facility-administered medications on file prior to visit.     Current Medications (verified) Current Outpatient Prescriptions  Medication Sig Dispense Refill  . aspirin 81 MG chewable tablet Chew 81 mg by mouth daily.    Marland Kitchen losartan-hydrochlorothiazide (HYZAAR) 100-25 MG tablet TAKE 1 TABLET BY MOUTH DAILY. 90 tablet 1  . simvastatin (ZOCOR) 40 MG tablet TAKE 1 TABLET (40 MG TOTAL) BY MOUTH AT BEDTIME. 90 tablet 1   No current facility-administered medications for this visit.      Allergies (verified) Metformin and related   PAST HISTORY  Family History Family History  Problem Relation Age of Onset  . Lung cancer Mother   . Aneurysm Father     brain   . Hypertension Sister   . Hypertension Sister     Social History Social History  Substance Use Topics  . Smoking status: Current Some  Day Smoker  . Smokeless tobacco: Never Used     Comment: 2 cigarettes since April 1  . Alcohol use No     Are there smokers in your home (other than you)? Yes  Risk Factors Current exercise habits: The patient does not participate in regular exercise at present.  Dietary issues discussed: Cholesterol lowering   Cardiac risk factors: advanced age (older than 46 for men, 35 for women), dyslipidemia, hypertension and smoking/ tobacco exposure.  Depression Screen (Note: if answer to either of the following is "Yes", a more complete depression screening is indicated)   Over the past two weeks, have you felt down, depressed or hopeless? No  Over the past two weeks, have you felt little interest or pleasure in doing things? No  Have you lost interest or pleasure in daily life? No  Do you often feel hopeless? No  Do you cry easily over simple problems? No  Activities of Daily Living In your present state of health, do you have any difficulty performing the following activities?:  Driving? No Managing money?  No Feeding yourself? No Getting from bed to chair? No Climbing a flight of stairs? No Preparing food and eating?: No Bathing or showering? No Getting dressed: No Getting to the toilet? No Using the toilet:No Moving around from place to place: No In the past year have you fallen or had a near fall?:No   Are you sexually active?  Yes  Do you have more than one partner?  No  Hearing Difficulties: No Do you often ask people to speak up or repeat themselves? No Do you experience ringing or noises in your ears? No Do you have difficulty understanding soft or whispered voices? No   Do you feel that you have a problem with memory? No  Do you often misplace items? No  Do you feel safe at home?  Yes  Cognitive Testing  Alert? Yes  Normal Appearance?Yes  Oriented to person? Yes  Place? Yes   Time? Yes  Recall of three objects?  Yes  Can perform simple calculations?  Yes  Displays appropriate judgment?Yes  Can read the correct time from a watch face?Yes   Advanced Directives have been discussed with the patient? Yes  List the Names of Other Physician/Practitioners you currently use: 1.    Indicate any recent Medical Services you may have received from other than [facility] providers in the past year (date may be approximate).  Immunization History  Administered Date(s) Administered  . Influenza Whole 08/02/2011  . Influenza,inj,Quad PF,36+ Mos 06/11/2013, 09/24/2014, 07/22/2015, 08/19/2016  . Pneumococcal Conjugate-13 02/05/2015  . Tdap 12/31/2010  . Zoster 12/31/2010    Screening Tests Health Maintenance  Topic Date Due  . PNA vac Low Risk Adult (2 of 2 - PPSV23) 02/05/2016  . MAMMOGRAM  02/17/2018  . COLONOSCOPY  02/20/2020  . TETANUS/TDAP  12/30/2020  . INFLUENZA VACCINE  Completed  . DEXA SCAN  Completed  . ZOSTAVAX  Completed  . Hepatitis C Screening  Completed    All answers were reviewed with the patient and necessary referrals were made:  Vanetta Mulders, LPN   [id number]   History reviewed: allergies, current medications, past family history, past medical history, past social history, past surgical history and problem list  Review of Systems A comprehensive review of systems was negative.    Objective:     Vision by Snellen chart: right eye:20/25, left eye:20/30  Body mass index is 32.28 kg/m. BP (!) 142/78   Pulse 86   Resp 18   Ht 5\' 6"  (1.676 m)   Wt 200 lb (90.7 kg)   SpO2 95%   BMI 32.28 kg/m   No exam performed today, annual wellness without physical exam.     Assessment:   Medicare annual wellness visit, subsequent Annual exam as documented. Counseling done  re healthy lifestyle involving commitment to 150 minutes exercise per week, heart healthy diet, and attaining healthy weight.The importance of adequate sleep also discussed. Regular seat belt use and home safety, is also discussed. Changes  in health habits are decided on by the patient with goals and time frames  set for achieving them. Immunization and cancer screening needs are specifically addressed at this visit.   Need for prophylactic vaccination and inoculation against influenza After obtaining informed consent, the vaccine is  administered by LPN.        Plan:     During the course of the visit the patient was educated and counseled about appropriate screening and preventive services including:    Influenza vaccine  Diet review for nutrition referral? Yes ____  Not Indicated __x__   Patient Instructions (the written plan) was given to the patient.  Medicare Attestation I have personally reviewed: The patient's medical and social history Their use of alcohol, tobacco or illicit drugs Their current medications and supplements The patient's functional ability including ADLs,fall risks, home safety risks, cognitive, and hearing and visual impairment Diet and physical activities Evidence for depression or mood  disorders  The patient's weight, height, BMI, and visual acuity have been recorded in the chart.  I have made referrals, counseling, and provided education to the patient based on review of the above and I have provided the patient with a written personalized care plan for preventive services.     Denman George [place], [place]   [id number]

## 2016-08-23 MED ORDER — PREDNISONE 5 MG PO TABS: 5.0000 mg | ORAL_TABLET | Freq: Two times a day (BID) | ORAL | 0 refills | Status: DC

## 2016-08-23 MED ORDER — VITAMIN D (ERGOCALCIFEROL) 1.25 MG (50000 UNIT) PO CAPS: 50000.0000 [IU] | ORAL_CAPSULE | ORAL | 5 refills | Status: DC

## 2016-08-23 MED ORDER — IBUPROFEN 800 MG PO TABS: 800.0000 mg | ORAL_TABLET | Freq: Two times a day (BID) | ORAL | 0 refills | Status: AC

## 2016-08-23 NOTE — Addendum Note (Signed)
Addended by: Denman George B on: 08/23/2016 10:02 AM   Modules accepted: Orders

## 2016-08-23 NOTE — Telephone Encounter (Signed)
Patient aware.  Medications sent to pharmacy.  She will call back if she continues to have problems.

## 2016-09-08 DIAGNOSIS — Z0000 Encounter for general adult medical examination without abnormal findings: Secondary | ICD-10-CM | POA: Insufficient documentation

## 2016-09-08 NOTE — Assessment & Plan Note (Signed)
After obtaining informed consent, the vaccine is  administered by LPN.  

## 2016-09-08 NOTE — Assessment & Plan Note (Signed)

## 2016-09-09 ENCOUNTER — Encounter: Payer: Self-pay | Admitting: Family Medicine

## 2016-09-13 ENCOUNTER — Other Ambulatory Visit: Payer: Self-pay | Admitting: Family Medicine

## 2016-10-13 DIAGNOSIS — E782 Mixed hyperlipidemia: Secondary | ICD-10-CM | POA: Diagnosis not present

## 2016-10-13 DIAGNOSIS — I10 Essential (primary) hypertension: Secondary | ICD-10-CM | POA: Diagnosis not present

## 2016-10-13 DIAGNOSIS — R7301 Impaired fasting glucose: Secondary | ICD-10-CM | POA: Diagnosis not present

## 2016-10-13 DIAGNOSIS — E559 Vitamin D deficiency, unspecified: Secondary | ICD-10-CM | POA: Diagnosis not present

## 2016-10-14 LAB — COMPREHENSIVE METABOLIC PANEL
ALT: 16 U/L (ref 6–29)
AST: 16 U/L (ref 10–35)
Albumin: 4.2 g/dL (ref 3.6–5.1)
Alkaline Phosphatase: 83 U/L (ref 33–130)
BUN: 14 mg/dL (ref 7–25)
CHLORIDE: 103 mmol/L (ref 98–110)
CO2: 28 mmol/L (ref 20–31)
CREATININE: 0.78 mg/dL (ref 0.50–0.99)
Calcium: 9.5 mg/dL (ref 8.6–10.4)
GLUCOSE: 94 mg/dL (ref 65–99)
Potassium: 3.6 mmol/L (ref 3.5–5.3)
SODIUM: 141 mmol/L (ref 135–146)
TOTAL PROTEIN: 7.1 g/dL (ref 6.1–8.1)
Total Bilirubin: 0.7 mg/dL (ref 0.2–1.2)

## 2016-10-14 LAB — LIPID PANEL
CHOLESTEROL: 172 mg/dL (ref <200)
HDL: 51 mg/dL (ref >50)
LDL Cholesterol: 104 mg/dL — ABNORMAL HIGH (ref <100)
Total CHOL/HDL Ratio: 3.4 Ratio (ref <5.0)
Triglycerides: 87 mg/dL (ref <150)
VLDL: 17 mg/dL (ref <30)

## 2016-10-14 LAB — TSH: TSH: 1.12 m[IU]/L

## 2016-10-14 LAB — HEMOGLOBIN A1C
HEMOGLOBIN A1C: 5.7 % — AB (ref <5.7)
MEAN PLASMA GLUCOSE: 117 mg/dL

## 2016-10-14 LAB — VITAMIN D 25 HYDROXY (VIT D DEFICIENCY, FRACTURES): Vit D, 25-Hydroxy: 47 ng/mL (ref 30–100)

## 2016-10-21 ENCOUNTER — Ambulatory Visit (INDEPENDENT_AMBULATORY_CARE_PROVIDER_SITE_OTHER): Payer: Medicare HMO | Admitting: Family Medicine

## 2016-10-21 ENCOUNTER — Encounter: Payer: Self-pay | Admitting: Family Medicine

## 2016-10-21 VITALS — BP 132/78 | HR 87 | Resp 16 | Ht 66.0 in | Wt 204.0 lb

## 2016-10-21 DIAGNOSIS — E782 Mixed hyperlipidemia: Secondary | ICD-10-CM

## 2016-10-21 DIAGNOSIS — Z0000 Encounter for general adult medical examination without abnormal findings: Secondary | ICD-10-CM | POA: Diagnosis not present

## 2016-10-21 DIAGNOSIS — Z23 Encounter for immunization: Secondary | ICD-10-CM

## 2016-10-21 DIAGNOSIS — E669 Obesity, unspecified: Secondary | ICD-10-CM

## 2016-10-21 DIAGNOSIS — Z1211 Encounter for screening for malignant neoplasm of colon: Secondary | ICD-10-CM

## 2016-10-21 DIAGNOSIS — I10 Essential (primary) hypertension: Secondary | ICD-10-CM

## 2016-10-21 DIAGNOSIS — Z780 Asymptomatic menopausal state: Secondary | ICD-10-CM

## 2016-10-21 DIAGNOSIS — R7301 Impaired fasting glucose: Secondary | ICD-10-CM

## 2016-10-21 DIAGNOSIS — F17218 Nicotine dependence, cigarettes, with other nicotine-induced disorders: Secondary | ICD-10-CM

## 2016-10-21 DIAGNOSIS — E559 Vitamin D deficiency, unspecified: Secondary | ICD-10-CM

## 2016-10-21 LAB — POC HEMOCCULT BLD/STL (OFFICE/1-CARD/DIAGNOSTIC): FECAL OCCULT BLD: NEGATIVE

## 2016-10-21 NOTE — Patient Instructions (Addendum)
F/U in 6 month, ca;ll if you need me sooner  Quit date for smoking is LATEST , Mother's day, You CAN DO THIS!  EAT CLEAN, eat GREEN to help with improved health and weight loss  Pneumonia 23 vaccine today Thankful that many good things are happening in your lefe!, congrats on 51 years!!!   You are referred for bone density test.  Fasting lipid, cmp , HBA1C and vit D in 5.5 months  Thank you  for choosing [facility]. We consider it a privelige to serve you.  Delivering excellent health care in a caring and  compassionate way is our goal.  Partnering with you,  so that together we can achieve this goal is our strategy.       Steps to Quit Smoking Smoking tobacco can be bad for your health. It can also affect almost every organ in your body. Smoking puts you and people around you at risk for many serious long-lasting (chronic) diseases. Quitting smoking is hard, but it is one of the best things that you can do for your health. It is never too late to quit. What are the benefits of quitting smoking? When you quit smoking, you lower your risk for getting serious diseases and conditions. They can include:  Lung cancer or lung disease.  Heart disease.  Stroke.  Heart attack.  Not being able to have children (infertility).  Weak bones (osteoporosis) and broken bones (fractures). If you have coughing, wheezing, and shortness of breath, those symptoms may get better when you quit. You may also get sick less often. If you are pregnant, quitting smoking can help to lower your chances of having a baby of low birth weight. What can I do to help me quit smoking? Talk with your doctor about what can help you quit smoking. Some things you can do (strategies) include:  Quitting smoking totally, instead of slowly cutting back how much you smoke over a period of time.  Going to in-person counseling. You are more likely to quit if you go to many counseling sessions.  Using  resources and support systems, such as:  Online chats with a Social worker.  Phone quitlines.  Printed Furniture conservator/restorer.  Support groups or group counseling.  Text messaging programs.  Mobile phone apps or applications.  Taking medicines. Some of these medicines may have nicotine in them. If you are pregnant or breastfeeding, do not take any medicines to quit smoking unless your doctor says it is okay. Talk with your doctor about counseling or other things that can help you. Talk with your doctor about using more than one strategy at the same time, such as taking medicines while you are also going to in-person counseling. This can help make quitting easier. What things can I do to make it easier to quit? Quitting smoking might feel very hard at first, but there is a lot that you can do to make it easier. Take these steps:  Talk to your family and friends. Ask them to support and encourage you.  Call phone quitlines, reach out to support groups, or work with a Social worker.  Ask people who smoke to not smoke around you.  Avoid places that make you want (trigger) to smoke, such as:  Bars.  Parties.  Smoke-break areas at work.  Spend time with people who do not smoke.  Lower the stress in your life. Stress can make you want to smoke. Try these things to help your stress:  Getting regular exercise.  Deep-breathing exercises.  Yoga.  Meditating.  Doing a body scan. To do this, close your eyes, focus on one area of your body at a time from head to toe, and notice which parts of your body are tense. Try to relax the muscles in those areas.  Download or buy apps on your mobile phone or tablet that can help you stick to your quit plan. There are many free apps, such as QuitGuide from the [employer] Office manager for Disease Control and Prevention). You can find more support from smokefree.gov and other websites. This information is not intended to replace advice given to you by your health  care provider. Make sure you discuss any questions you have with your health care provider. Document Released: 07/03/2009 Document Revised: 05/04/2016 Document Reviewed: 01/21/2015 Elsevier Interactive Patient Education  2017 [employer].

## 2016-10-22 ENCOUNTER — Encounter: Payer: Self-pay | Admitting: Family Medicine

## 2016-10-22 DIAGNOSIS — Z23 Encounter for immunization: Secondary | ICD-10-CM | POA: Insufficient documentation

## 2016-10-22 NOTE — Assessment & Plan Note (Signed)

## 2016-10-22 NOTE — Assessment & Plan Note (Signed)
After obtaining informed consent, the vaccine is  administered by LPN.  

## 2016-10-22 NOTE — Progress Notes (Signed)
Amanda Simon     MRN: [id number]      DOB: [birth year]  HPI: Patient is in for annual physical exam. No other health concerns are expressed or addressed at the visit. Recent labs,  are reviewed. Immunization is reviewed , and  updated if needed.   PE: BP 132/78   Pulse 87   Resp 16   Ht 5\' 6"  (1.676 m)   Wt 204 lb (92.5 kg)   SpO2 97%   BMI 32.93 kg/m   Pleasant  female, alert and oriented x 3, in no cardio-pulmonary distress. Afebrile. HEENT No facial trauma or asymetry. Sinuses non tender.  Extra occullar muscles intact, pupils equally reactive to light. External ears normal, tympanic membranes clear. Oropharynx moist, no exudate. Neck: supple, no adenopathy,JVD or thyromegaly.No bruits.  Chest: Clear to ascultation bilaterally.No crackles or wheezes. Non tender to palpation  Breast: No asymetry,no masses or lumps. No tenderness. No nipple discharge or inversion. No axillary or supraclavicular adenopathy  Cardiovascular system; Heart sounds normal,  S1 and  S2 ,no S3.  No murmur, or thrill. Apical beat not displaced Peripheral pulses normal.  Abdomen: Soft, non tender, no organomegaly or masses. No bruits. Bowel sounds normal. No guarding, tenderness or rebound.  Rectal:  Normal sphincter tone. No rectal mass. Guaiac negative stool.  GU: External genitalia normal female genitalia , normal female distribution of hair. No lesions. Urethral meatus normal in size, no  Prolapse, no lesions visibly  Present. Bladder non tender. Vagina pink and moist , with no visible lesions , discharge present . Adequate pelvic support no  cystocele or rectocele noted Cervix pink and appears healthy, no lesions or ulcerations noted, no discharge noted from os Uterus absent, no adnexal masses, no adnexal tenderness.   Musculoskeletal exam: Full ROM of spine, hips , shoulders and knees. No deformity ,swelling or crepitus noted. No muscle wasting or atrophy.    Neurologic: Cranial nerves 2 to 12 intact. Power, tone ,sensation and reflexes normal throughout. No disturbance in gait. No tremor.  Skin: Intact, no ulceration, erythema , scaling or rash noted. Pigmentation normal throughout  Psych; Normal mood and affect. Judgement and concentration normal   Assessment & Plan:  Annual physical exam Annual exam as documented. Counseling done  re healthy lifestyle involving commitment to 150 minutes exercise per week, heart healthy diet, and attaining healthy weight.The importance of adequate sleep also discussed. Changes in health habits are decided on by the patient with goals and time frames  set for achieving them. Immunization and cancer screening needs are specifically addressed at this visit.   Nicotine dependence Patient counseled for approximately 5 minutes regarding the health risks of ongoing nicotine use, specifically all types of cancer, heart disease, stroke and respiratory failure. The options available for help with cessation ,the behavioral changes to assist the process, and the option to either gradully reduce usage  Or abruptly stop.is also discussed. Pt is also encouraged to set specific goals in number of cigarettes used daily, as well as to set a quit date.     Obesity (BMI 30.0-34.9) Deteriorated. Patient re-educated about  the importance of commitment to a  minimum of 150 minutes of exercise per week.  The importance of healthy food choices with portion control discussed. Encouraged to start a food diary, count calories and to consider  joining a support group. Sample diet sheets offered. Goals set by the patient for the next several months.   Weight /BMI 10/21/2016 08/19/2016 11/19/2015  WEIGHT 204 lb 200 lb 202 lb 12.8 oz  HEIGHT 5\' 6"  5\' 6"  5\' 6"   BMI 32.93 kg/m2 32.28 kg/m2 32.75 kg/m2      Need for 23-polyvalent pneumococcal polysaccharide vaccine After obtaining informed consent, the vaccine is   administered by LPN.

## 2016-10-22 NOTE — Assessment & Plan Note (Signed)
Deteriorated. Patient re-educated about  the importance of commitment to a  minimum of 150 minutes of exercise per week.  The importance of healthy food choices with portion control discussed. Encouraged to start a food diary, count calories and to consider  joining a support group. Sample diet sheets offered. Goals set by the patient for the next several months.   Weight /BMI 10/21/2016 08/19/2016 11/19/2015  WEIGHT 204 lb 200 lb 202 lb 12.8 oz  HEIGHT 5\' 6"  5\' 6"  5\' 6"   BMI 32.93 kg/m2 32.28 kg/m2 32.75 kg/m2

## 2016-10-22 NOTE — Assessment & Plan Note (Signed)
Annual exam as documented. Counseling done  re healthy lifestyle involving commitment to 150 minutes exercise per week, heart healthy diet, and attaining healthy weight.The importance of adequate sleep also discussed. Changes in health habits are decided on by the patient with goals and time frames  set for achieving them. Immunization and cancer screening needs are specifically addressed at this visit. 

## 2016-12-21 ENCOUNTER — Encounter: Payer: Commercial Managed Care - HMO | Admitting: Family Medicine

## 2017-02-14 DIAGNOSIS — H5203 Hypermetropia, bilateral: Secondary | ICD-10-CM | POA: Diagnosis not present

## 2017-02-15 ENCOUNTER — Other Ambulatory Visit: Payer: Self-pay | Admitting: Family Medicine

## 2017-02-16 ENCOUNTER — Ambulatory Visit: Payer: Commercial Managed Care - HMO | Admitting: Family Medicine

## 2017-02-26 ENCOUNTER — Other Ambulatory Visit: Payer: Self-pay | Admitting: Family Medicine

## 2017-03-24 ENCOUNTER — Other Ambulatory Visit: Payer: Self-pay | Admitting: Family Medicine

## 2017-03-24 DIAGNOSIS — Z1231 Encounter for screening mammogram for malignant neoplasm of breast: Secondary | ICD-10-CM

## 2017-03-25 ENCOUNTER — Ambulatory Visit (HOSPITAL_COMMUNITY)
Admission: RE | Admit: 2017-03-25 | Discharge: 2017-03-25 | Disposition: A | Discharge status: Home / Self Care | Payer: Medicare HMO | Source: Ambulatory Visit | Attending: Family Medicine | Admitting: Family Medicine

## 2017-03-25 DIAGNOSIS — E559 Vitamin D deficiency, unspecified: Secondary | ICD-10-CM | POA: Diagnosis not present

## 2017-03-25 DIAGNOSIS — R7301 Impaired fasting glucose: Secondary | ICD-10-CM | POA: Diagnosis not present

## 2017-03-25 DIAGNOSIS — Z1231 Encounter for screening mammogram for malignant neoplasm of breast: Secondary | ICD-10-CM | POA: Insufficient documentation

## 2017-03-25 DIAGNOSIS — E782 Mixed hyperlipidemia: Secondary | ICD-10-CM | POA: Diagnosis not present

## 2017-03-25 DIAGNOSIS — I10 Essential (primary) hypertension: Secondary | ICD-10-CM | POA: Diagnosis not present

## 2017-03-25 LAB — LIPID PANEL
CHOL/HDL RATIO: 3.1 ratio (ref <5.0)
Cholesterol: 162 mg/dL (ref <200)
HDL: 53 mg/dL (ref >50)
LDL CALC: 94 mg/dL (ref <100)
TRIGLYCERIDES: 74 mg/dL (ref <150)
VLDL: 15 mg/dL (ref <30)

## 2017-03-25 LAB — COMPREHENSIVE METABOLIC PANEL
ALK PHOS: 87 U/L (ref 33–130)
ALT: 15 U/L (ref 6–29)
AST: 16 U/L (ref 10–35)
Albumin: 4.1 g/dL (ref 3.6–5.1)
BILIRUBIN TOTAL: 0.5 mg/dL (ref 0.2–1.2)
BUN: 13 mg/dL (ref 7–25)
CHLORIDE: 103 mmol/L (ref 98–110)
CO2: 29 mmol/L (ref 20–31)
Calcium: 9.2 mg/dL (ref 8.6–10.4)
Creat: 0.72 mg/dL (ref 0.50–0.99)
Glucose, Bld: 102 mg/dL — ABNORMAL HIGH (ref 65–99)
POTASSIUM: 3.9 mmol/L (ref 3.5–5.3)
SODIUM: 139 mmol/L (ref 135–146)
TOTAL PROTEIN: 7.3 g/dL (ref 6.1–8.1)

## 2017-03-26 LAB — HEMOGLOBIN A1C
HEMOGLOBIN A1C: 6.2 % — AB (ref <5.7)
Mean Plasma Glucose: 131 mg/dL

## 2017-03-26 LAB — VITAMIN D 25 HYDROXY (VIT D DEFICIENCY, FRACTURES): VIT D 25 HYDROXY: 40 ng/mL (ref 30–100)

## 2017-04-17 ENCOUNTER — Other Ambulatory Visit: Payer: Self-pay | Admitting: Family Medicine

## 2017-04-20 ENCOUNTER — Encounter: Payer: Self-pay | Admitting: Family Medicine

## 2017-04-20 ENCOUNTER — Ambulatory Visit (INDEPENDENT_AMBULATORY_CARE_PROVIDER_SITE_OTHER): Payer: Medicare HMO | Admitting: Family Medicine

## 2017-04-20 VITALS — BP 130/82 | HR 81 | Resp 16 | Ht 66.0 in | Wt 208.0 lb

## 2017-04-20 DIAGNOSIS — E782 Mixed hyperlipidemia: Secondary | ICD-10-CM

## 2017-04-20 DIAGNOSIS — I10 Essential (primary) hypertension: Secondary | ICD-10-CM | POA: Diagnosis not present

## 2017-04-20 DIAGNOSIS — R7302 Impaired glucose tolerance (oral): Secondary | ICD-10-CM

## 2017-04-20 DIAGNOSIS — E669 Obesity, unspecified: Secondary | ICD-10-CM

## 2017-04-20 DIAGNOSIS — Z87891 Personal history of nicotine dependence: Secondary | ICD-10-CM

## 2017-04-20 MED ORDER — METFORMIN HCL 500 MG PO TABS: 500.0000 mg | ORAL_TABLET | Freq: Every day | ORAL | 1 refills | Status: DC

## 2017-04-20 NOTE — Assessment & Plan Note (Signed)
Updated dexa needed. Daily exercise and calcium with d supplement to continue

## 2017-04-20 NOTE — Assessment & Plan Note (Signed)
Deteriorated, needs to resume metformin Patient educated about the importance of limiting  Carbohydrate intake , the need to commit to daily physical activity for a minimum of 30 minutes , and to commit weight loss. The fact that changes in all these areas will reduce or eliminate all together the development of diabetes is stressed.   Diabetic Labs Latest Ref Rng & Units 03/25/2017 10/13/2016 08/16/2016 11/13/2015 07/02/2015  HbA1c <5.7 % 6.2(H) 5.7(H) 5.8(H) 6.0(H) 6.4(H)  Chol <200 mg/dL 162 172 166 - 157  HDL >50 mg/dL 53 51 50(L) - 51  Calc LDL <100 mg/dL 94 104(H) 104(H) - 93  Triglycerides <150 mg/dL 74 87 62 - 66  Creatinine 0.50 - 0.99 mg/dL 0.72 0.78 0.73 0.63 0.70   BP/Weight 04/20/2017 10/21/2016 08/19/2016 11/19/2015 07/22/2015 02/20/2015 8/45/3646  Systolic BP 803 212 248 250 037 048 889  Diastolic BP 82 78 78 80 78 71 78  Wt. (Lbs) 208 204 200 202.8 205.4 - 205  BMI 33.57 32.93 32.28 32.75 33.17 - 33.58   No flowsheet data found.

## 2017-04-20 NOTE — Assessment & Plan Note (Signed)
Deteriorated. Patient re-educated about  the importance of commitment to a  minimum of 150 minutes of exercise per week.  The importance of healthy food choices with portion control discussed. Encouraged to start a food diary, count calories and to consider  joining a support group. Sample diet sheets offered. Goals set by the patient for the next several months.   Weight /BMI 04/20/2017 10/21/2016 08/19/2016  WEIGHT 208 lb 204 lb 200 lb  HEIGHT 5\' 6"  5\' 6"  5\' 6"   BMI 33.57 kg/m2 32.93 kg/m2 32.28 kg/m2

## 2017-04-20 NOTE — Progress Notes (Signed)
Amanda Simon     MRN: [id number]      DOB: [birth year]   HPI Amanda Simon is here for follow up and re-evaluation of chronic medical conditions, medication management and review of any available recent lab and radiology data.  Preventive health is updated, specifically  Cancer screening and Immunization.   Questions or concerns regarding consultations or procedures which the PT has had in the interim are  addressed. The PT denies any adverse reactions to current medications since the last visit.  There are no new concerns. 68 y/o dog is dying and this is stressful There are no specific complaints   ROS Denies recent fever or chills. Denies sinus pressure, nasal congestion, ear pain or sore throat. Denies chest congestion, productive cough or wheezing. Denies chest pains, palpitations and leg swelling Denies abdominal pain, nausea, vomiting,diarrhea or constipation.   Denies dysuria, frequency, hesitancy or incontinence. Denies joint pain, swelling and limitation in mobility. Denies headaches, seizures, numbness, or tingling. Denies depression, anxiety or insomnia. Denies skin break down or rash.   PE  BP 130/82   Pulse 81   Resp 16   Ht 5\' 6"  (1.676 m)   Wt 208 lb (94.3 kg)   SpO2 95%   BMI 33.57 kg/m   Patient alert and oriented and in no cardiopulmonary distress.  HEENT: No facial asymmetry, EOMI,   oropharynx pink and moist.  Neck supple no JVD, no mass.  Chest: Clear to auscultation bilaterally.decreased though adequate air entry  CVS: S1, S2 no murmurs, no S3.Regular rate.  ABD: Soft non tender.   Ext: No edema  MS: Adequate ROM spine, shoulders, hips and knees.  Skin: Intact, no ulcerations or rash noted.  Psych: Good eye contact, normal affect. Memory intact not anxious or depressed appearing.  CNS: CN 2-12 intact, power,  normal throughout.no focal deficits noted.   Assessment & Plan Essential hypertension Controlled, no change in medication DASH  diet and commitment to daily physical activity for a minimum of 30 minutes discussed and encouraged, as a part of hypertension management. The importance of attaining a healthy weight is also discussed.  BP/Weight 04/20/2017 10/21/2016 08/19/2016 11/19/2015 07/22/2015 02/20/2015 8/33/8250  Systolic BP 539 767 341 937 902 409 735  Diastolic BP 82 78 78 80 78 71 78  Wt. (Lbs) 208 204 200 202.8 205.4 - 205  BMI 33.57 32.93 32.28 32.75 33.17 - 33.58       Obesity (BMI 30.0-34.9) Deteriorated. Patient re-educated about  the importance of commitment to a  minimum of 150 minutes of exercise per week.  The importance of healthy food choices with portion control discussed. Encouraged to start a food diary, count calories and to consider  joining a support group. Sample diet sheets offered. Goals set by the patient for the next several months.   Weight /BMI 04/20/2017 10/21/2016 08/19/2016  WEIGHT 208 lb 204 lb 200 lb  HEIGHT 5\' 6"  5\' 6"  5\' 6"   BMI 33.57 kg/m2 32.93 kg/m2 32.28 kg/m2      Hyperlipemia Controlled, no change in medication Hyperlipidemia:Low fat diet discussed and encouraged.   Lipid Panel  Lab Results  Component Value Date   CHOL 162 03/25/2017   HDL 53 03/25/2017   LDLCALC 94 03/25/2017   TRIG 74 03/25/2017   CHOLHDL 3.1 03/25/2017       IGT (impaired glucose tolerance) Deteriorated, needs to resume metformin Patient educated about the importance of limiting  Carbohydrate intake , the need to commit to daily  physical activity for a minimum of 30 minutes , and to commit weight loss. The fact that changes in all these areas will reduce or eliminate all together the development of diabetes is stressed.   Diabetic Labs Latest Ref Rng & Units 03/25/2017 10/13/2016 08/16/2016 11/13/2015 07/02/2015  HbA1c <5.7 % 6.2(H) 5.7(H) 5.8(H) 6.0(H) 6.4(H)  Chol <200 mg/dL 162 172 166 - 157  HDL >50 mg/dL 53 51 50(L) - 51  Calc LDL <100 mg/dL 94 104(H) 104(H) - 93  Triglycerides <150  mg/dL 74 87 62 - 66  Creatinine 0.50 - 0.99 mg/dL 0.72 0.78 0.73 0.63 0.70   BP/Weight 04/20/2017 10/21/2016 08/19/2016 11/19/2015 07/22/2015 02/20/2015 03/10/3085  Systolic BP 578 469 629 528 413 244 010  Diastolic BP 82 78 78 80 78 71 78  Wt. (Lbs) 208 204 200 202.8 205.4 - 205  BMI 33.57 32.93 32.28 32.75 33.17 - 33.58   No flowsheet data found.    OSTEOPENIA Updated dexa needed. Daily exercise and calcium with d supplement to continue  H/O nicotine dependence Quit in jubne 2018, has over 30 pack year history and needs screening to age 89, mother died of lung cancer

## 2017-04-20 NOTE — Assessment & Plan Note (Signed)
Controlled, no change in medication Hyperlipidemia:Low fat diet discussed and encouraged.   Lipid Panel  Lab Results  Component Value Date   CHOL 162 03/25/2017   HDL 53 03/25/2017   LDLCALC 94 03/25/2017   TRIG 74 03/25/2017   CHOLHDL 3.1 03/25/2017

## 2017-04-20 NOTE — Patient Instructions (Addendum)
Wellness visit with nurse in December , call if you nneed me before   Physical exam with MD Feb 2 or after  Congrats on quitting smoking, continue to keep it this way  It is important that you exercise regularly at least 30 minutes 5 times a week. If you develop chest pain, have severe difficulty breathing, or feel very tired, stop exercising immediately and seek medical attention   Please work on good  health habits so that your health will improve. 1. Commitment to daily physical activity for 30 to 60  minutes, if you are able to do this.  2. Commitment to wise food choices. Aim for half of your  food intake to be vegetable and fruit, one quarter starchy foods, and one quarter protein. Try to eat on a regular schedule  3 meals per day, snacking between meals should be limited to vegetables or fruits or small portions of nuts. 64 ounces of water per day is generally recommended, unless you have specific health conditions, like heart failure or kidney failure where you will need to limit fluid intake.  3. Commitment to sufficient and a  good quality of physical and mental rest daily, generally between 6 to 8 hours per day.  WITH PERSISTANCE AND PERSEVERANCE, THE IMPOSSIBLE , BECOMES THE NORM!   Thank you  for choosing [facility]. We consider it a privelige to serve you.  Delivering excellent health care in a caring and  compassionate way is our goal.  Partnering with you,  so that together we can achieve this goal is our strategy.

## 2017-04-20 NOTE — Assessment & Plan Note (Signed)
Controlled, no change in medication DASH diet and commitment to daily physical activity for a minimum of 30 minutes discussed and encouraged, as a part of hypertension management. The importance of attaining a healthy weight is also discussed.  BP/Weight 04/20/2017 10/21/2016 08/19/2016 11/19/2015 07/22/2015 02/20/2015 5/61/5379  Systolic BP 432 761 470 929 [phone]  Diastolic BP 82 78 78 80 78 71 78  Wt. (Lbs) 208 204 200 202.8 205.4 - 205  BMI 33.57 32.93 32.28 32.75 33.17 - 33.58

## 2017-04-20 NOTE — Assessment & Plan Note (Addendum)
Quit in jubne 2018, has over 30 pack year history and needs screening to age 66, mother died of lung cancer

## 2017-04-21 ENCOUNTER — Telehealth: Payer: Self-pay | Admitting: *Deleted

## 2017-04-21 NOTE — Telephone Encounter (Signed)
Patient called to schedule her lung screening. Please call patient at [phone]

## 2017-04-29 NOTE — Telephone Encounter (Signed)
scheduled

## 2017-05-04 ENCOUNTER — Ambulatory Visit (HOSPITAL_COMMUNITY): Payer: Medicare HMO

## 2017-05-16 ENCOUNTER — Ambulatory Visit (HOSPITAL_COMMUNITY): Payer: Medicare HMO

## 2017-06-29 ENCOUNTER — Ambulatory Visit (INDEPENDENT_AMBULATORY_CARE_PROVIDER_SITE_OTHER): Payer: Medicare HMO

## 2017-06-29 VITALS — BP 130/74 | HR 76 | Temp 97.9°F | Ht 66.0 in | Wt 207.0 lb

## 2017-06-29 DIAGNOSIS — Z23 Encounter for immunization: Secondary | ICD-10-CM

## 2017-06-29 DIAGNOSIS — Z0000 Encounter for general adult medical examination without abnormal findings: Secondary | ICD-10-CM | POA: Diagnosis not present

## 2017-06-29 NOTE — Progress Notes (Signed)
Subjective:   CAROLIN QUANG is a 66 y.o. female who presents for Medicare Annual (Subsequent) preventive examination.  Review of Systems:  Cardiac Risk Factors include: advanced age (>22men, >44 women);dyslipidemia;hypertension;obesity (BMI >30kg/m2)     Objective:     Vitals: BP 130/74   Pulse 76   Temp 97.9 F (36.6 C) (Temporal)   Ht 5\' 6"  (1.676 m)   Wt 207 lb 0.6 oz (93.9 kg)   SpO2 95%   BMI 33.42 kg/m   Body mass index is 33.42 kg/m.   Tobacco History  Smoking Status  . Former Smoker  . Packs/day: 1.00  . Years: 35.00  . Types: Cigarettes  . Quit date: 03/01/2017  Smokeless Tobacco  . Never Used     Counseling given: Not Answered   Past Medical History:  Diagnosis Date  . Depression   . Hyperlipidemia   . Hypertension   . IGT (impaired glucose tolerance) 2014  . Obesity   . Osteopenia    Past Surgical History:  Procedure Laterality Date  . ABDOMINAL HYSTERECTOMY  1975   fibroids  . COLONOSCOPY N/A 02/20/2015   Procedure: COLONOSCOPY;  Surgeon: Rogene Houston, MD;  Location: [facility];  Service: Endoscopy;  Laterality: N/A;  930  . PARTIAL HYSTERECTOMY    . TUBAL LIGATION     Family History  Problem Relation Age of Onset  . Lung cancer Mother   . Aneurysm Father        brain   . Hypertension Sister   . Hypertension Sister    History  Sexual Activity  . Sexual activity: Yes  . Birth control/ protection: Surgical    Outpatient Encounter Prescriptions as of 06/29/2017  Medication Sig  . aspirin 81 MG chewable tablet Chew 81 mg by mouth daily.  . cholecalciferol (VITAMIN D) 400 units TABS tablet Take 400 Units by mouth daily.  Marland Kitchen losartan-hydrochlorothiazide (HYZAAR) 100-25 MG tablet TAKE 1 TABLET BY MOUTH DAILY.  . metFORMIN (GLUCOPHAGE) 500 MG tablet Take 1 tablet (500 mg total) by mouth daily with breakfast.  . simvastatin (ZOCOR) 40 MG tablet TAKE 1 TABLET (40 MG TOTAL) BY MOUTH AT BEDTIME.   No facility-administered encounter  medications on file as of 06/29/2017.     Activities of Daily Living In your present state of health, do you have any difficulty performing the following activities: 06/29/2017 08/20/2016  Hearing? N N  Vision? N N  Difficulty concentrating or making decisions? N N  Walking or climbing stairs? N N  Dressing or bathing? N N  Doing errands, shopping? N N  Preparing Food and eating ? N -  Using the Toilet? N -  In the past six months, have you accidently leaked urine? N -  Do you have problems with loss of bowel control? N -  Managing your Medications? N -  Managing your Finances? N -  Housekeeping or managing your Housekeeping? N -  Some recent data might be hidden    Patient Care Team: Fayrene Helper, MD as PCP - General    Assessment:    Exercise Activities and Dietary recommendations Current Exercise Habits: Home exercise routine, Time (Minutes): 15, Frequency (Times/Week): 2, Weekly Exercise (Minutes/Week): 30, Intensity: Mild, Exercise limited by: None identified  Goals    . Exercise 3x per week (30 min per time)          Recommend increasing your exercise program to at least 3 days a week for 30-45 minutes at a  time as tolerated.        Fall Risk Fall Risk  06/29/2017 10/21/2016 08/20/2016 07/22/2015 02/05/2015  Falls in the past year? No No No No No   Depression Screen PHQ 2/9 Scores 06/29/2017 08/20/2016 02/05/2015  PHQ - 2 Score 1 0 2  PHQ- 9 Score - - 9     Cognitive Function: Normal  6CIT Screen 06/29/2017  What Year? 0 points  What month? 0 points  What time? 0 points  Count back from 20 0 points  Months in reverse 0 points  Repeat phrase 0 points  Total Score 0    Immunization History  Administered Date(s) Administered  . Influenza Whole 08/02/2011  . Influenza,inj,Quad PF,6+ Mos 06/11/2013, 09/24/2014, 07/22/2015, 08/19/2016, 06/29/2017  . Pneumococcal Conjugate-13 02/05/2015  . Pneumococcal Polysaccharide-23 10/21/2016  . Tdap 12/31/2010  .  Zoster 12/31/2010   Screening Tests Health Maintenance  Topic Date Due  . MAMMOGRAM  03/26/2019  . COLONOSCOPY  02/20/2020  . TETANUS/TDAP  12/30/2020  . INFLUENZA VACCINE  Completed  . DEXA SCAN  Completed  . Hepatitis C Screening  Completed  . PNA vac Low Risk Adult  Completed      Plan:   I have personally reviewed and noted the following in the patient's chart:   . Medical and social history . Use of alcohol, tobacco or illicit drugs  . Current medications and supplements . Functional ability and status . Nutritional status . Physical activity . Advanced directives . List of other physicians . Hospitalizations, surgeries, and ER visits in previous 12 months . Vitals . Screenings to include cognitive, depression, and falls . Referrals and appointments  In addition, I have reviewed and discussed with patient certain preventive protocols, quality metrics, and best practice recommendations. A written personalized care plan for preventive services as well as general preventive health recommendations were provided to patient.     Stormy Fabian, LPN  00/37/0488

## 2017-06-29 NOTE — Patient Instructions (Addendum)
[name] , Thank you for taking time to come for your Medicare Wellness Visit. I appreciate your ongoing commitment to your health goals. Please review the following plan we discussed and let me know if I can assist you in the future.   Screening recommendations/referrals: Colonoscopy: Up to date, next due 07/2025 Mammogram: Up to date, next due 03/2018 Bone Density: Up to date Recommended yearly ophthalmology/optometry visit for glaucoma screening and checkup Recommended yearly dental visit for hygiene and checkup  Vaccinations: Influenza vaccine: Due Pneumococcal vaccine: Up to date Tdap vaccine: Up to date, next due 12/2020 Shingles vaccine: Completed    Advanced directives: Advance directive discussed with you today. I have provided a copy for you to complete at home and have notarized. Once this is complete please bring a copy in to our office so we can scan it into your chart.  Conditions/risks identified: Obese, recommend increasing your exercise program to at least 3 days a week for 30-45 minutes at a time as tolerated.   Next appointment: Follow up with Dr. Moshe Cipro on 10/25/2017 at 10:20 am for your complete physical. Follow up in 1 year for your annual wellness visit.  Preventive Care 68 Years and Older, Female Preventive care refers to lifestyle choices and visits with your health care provider that can promote health and wellness. What does preventive care include?  A yearly physical exam. This is also called an annual well check.  Dental exams once or twice a year.  Routine eye exams. Ask your health care provider how often you should have your eyes checked.  Personal lifestyle choices, including:  Daily care of your teeth and gums.  Regular physical activity.  Eating a healthy diet.  Avoiding tobacco and drug use.  Limiting alcohol use.  Practicing safe sex.  Taking low-dose aspirin every day.  Taking vitamin and mineral supplements as recommended by your  health care provider. What happens during an annual well check? The services and screenings done by your health care provider during your annual well check will depend on your age, overall health, lifestyle risk factors, and family history of disease. Counseling  Your health care provider may ask you questions about your:  Alcohol use.  Tobacco use.  Drug use.  Emotional well-being.  Home and relationship well-being.  Sexual activity.  Eating habits.  History of falls.  Memory and ability to understand (cognition).  Work and work Statistician.  Reproductive health. Screening  You may have the following tests or measurements:  Height, weight, and BMI.  Blood pressure.  Lipid and cholesterol levels. These may be checked every 5 years, or more frequently if you are over 49 years old.  Skin check.  Lung cancer screening. You may have this screening every year starting at age 68 if you have a 30-pack-year history of smoking and currently smoke or have quit within the past 15 years.  Fecal occult blood test (FOBT) of the stool. You may have this test every year starting at age 68.  Flexible sigmoidoscopy or colonoscopy. You may have a sigmoidoscopy every 5 years or a colonoscopy every 10 years starting at age 68.  Hepatitis C blood test.  Hepatitis B blood test.  Sexually transmitted disease (STD) testing.  Diabetes screening. This is done by checking your blood sugar (glucose) after you have not eaten for a while (fasting). You may have this done every 1-3 years.  Bone density scan. This is done to screen for osteoporosis. You may have this done starting  at age 68.  Mammogram. This may be done every 1-2 years. Talk to your health care provider about how often you should have regular mammograms. Talk with your health care provider about your test results, treatment options, and if necessary, the need for more tests. Vaccines  Your health care provider may recommend  certain vaccines, such as:  Influenza vaccine. This is recommended every year.  Tetanus, diphtheria, and acellular pertussis (Tdap, Td) vaccine. You may need a Td booster every 10 years.  Zoster vaccine. You may need this after age 68.  Pneumococcal 13-valent conjugate (PCV13) vaccine. One dose is recommended after age 68.  Pneumococcal polysaccharide (PPSV23) vaccine. One dose is recommended after age 68. Talk to your health care provider about which screenings and vaccines you need and how often you need them. This information is not intended to replace advice given to you by your health care provider. Make sure you discuss any questions you have with your health care provider. Document Released: 10/03/2015 Document Revised: 05/26/2016 Document Reviewed: 07/08/2015 Elsevier Interactive Patient Education  2017 [employer] Prevention in the Home Falls can cause injuries. They can happen to people of all ages. There are many things you can do to make your home safe and to help prevent falls. What can I do on the outside of my home?  Regularly fix the edges of walkways and driveways and fix any cracks.  Remove anything that might make you trip as you walk through a door, such as a raised step or threshold.  Trim any bushes or trees on the path to your home.  Use bright outdoor lighting.  Clear any walking paths of anything that might make someone trip, such as rocks or tools.  Regularly check to see if handrails are loose or broken. Make sure that both sides of any steps have handrails.  Any raised decks and porches should have guardrails on the edges.  Have any leaves, snow, or ice cleared regularly.  Use sand or salt on walking paths during winter.  Clean up any spills in your garage right away. This includes oil or grease spills. What can I do in the bathroom?  Use night lights.  Install grab bars by the toilet and in the tub and shower. Do not use towel bars as  grab bars.  Use non-skid mats or decals in the tub or shower.  If you need to sit down in the shower, use a plastic, non-slip stool.  Keep the floor dry. Clean up any water that spills on the floor as soon as it happens.  Remove soap buildup in the tub or shower regularly.  Attach bath mats securely with double-sided non-slip rug tape.  Do not have throw rugs and other things on the floor that can make you trip. What can I do in the bedroom?  Use night lights.  Make sure that you have a light by your bed that is easy to reach.  Do not use any sheets or blankets that are too big for your bed. They should not hang down onto the floor.  Have a firm chair that has side arms. You can use this for support while you get dressed.  Do not have throw rugs and other things on the floor that can make you trip. What can I do in the kitchen?  Clean up any spills right away.  Avoid walking on wet floors.  Keep items that you use a lot in easy-to-reach places.  If  you need to reach something above you, use a strong step stool that has a grab bar.  Keep electrical cords out of the way.  Do not use floor polish or wax that makes floors slippery. If you must use wax, use non-skid floor wax.  Do not have throw rugs and other things on the floor that can make you trip. What can I do with my stairs?  Do not leave any items on the stairs.  Make sure that there are handrails on both sides of the stairs and use them. Fix handrails that are broken or loose. Make sure that handrails are as long as the stairways.  Check any carpeting to make sure that it is firmly attached to the stairs. Fix any carpet that is loose or worn.  Avoid having throw rugs at the top or bottom of the stairs. If you do have throw rugs, attach them to the floor with carpet tape.  Make sure that you have a light switch at the top of the stairs and the bottom of the stairs. If you do not have them, ask someone to add them  for you. What else can I do to help prevent falls?  Wear shoes that:  Do not have high heels.  Have rubber bottoms.  Are comfortable and fit you well.  Are closed at the toe. Do not wear sandals.  If you use a stepladder:  Make sure that it is fully opened. Do not climb a closed stepladder.  Make sure that both sides of the stepladder are locked into place.  Ask someone to hold it for you, if possible.  Clearly mark and make sure that you can see:  Any grab bars or handrails.  First and last steps.  Where the edge of each step is.  Use tools that help you move around (mobility aids) if they are needed. These include:  Canes.  Walkers.  Scooters.  Crutches.  Turn on the lights when you go into a dark area. Replace any light bulbs as soon as they burn out.  Set up your furniture so you have a clear path. Avoid moving your furniture around.  If any of your floors are uneven, fix them.  If there are any pets around you, be aware of where they are.  Review your medicines with your doctor. Some medicines can make you feel dizzy. This can increase your chance of falling. Ask your doctor what other things that you can do to help prevent falls. This information is not intended to replace advice given to you by your health care provider. Make sure you discuss any questions you have with your health care provider. Document Released: 07/03/2009 Document Revised: 02/12/2016 Document Reviewed: 10/11/2014 Elsevier Interactive Patient Education  2017 [employer].

## 2017-09-05 ENCOUNTER — Ambulatory Visit: Payer: Medicare HMO

## 2017-10-17 ENCOUNTER — Telehealth: Payer: Self-pay | Admitting: Family Medicine

## 2017-10-17 DIAGNOSIS — R7302 Impaired glucose tolerance (oral): Secondary | ICD-10-CM

## 2017-10-17 DIAGNOSIS — E782 Mixed hyperlipidemia: Secondary | ICD-10-CM

## 2017-10-17 DIAGNOSIS — I10 Essential (primary) hypertension: Secondary | ICD-10-CM

## 2017-10-17 DIAGNOSIS — E559 Vitamin D deficiency, unspecified: Secondary | ICD-10-CM

## 2017-10-17 NOTE — Telephone Encounter (Signed)
Cb#: [phone]   Patient is requesting blood work before her appt on 10/25/17 (cpe)  I dont see any orders or requests for blood work on last avs.  If she needs it please send order to a lab near her home in [place]

## 2017-10-18 NOTE — Telephone Encounter (Signed)
Ordered, unable to reach patient to inform

## 2017-10-18 NOTE — Addendum Note (Signed)
Addended by: Merceda Elks on: 10/18/2017 12:37 PM   Modules accepted: Orders

## 2017-10-18 NOTE — Telephone Encounter (Signed)
Please advise what labs.

## 2017-10-18 NOTE — Telephone Encounter (Signed)
pls order CBC, fasting lipid, cmp and eGFr, hBA1c, tSH and vit D, active dx are hTN, hyperlipidemia, iGT, vit d def

## 2017-10-22 DIAGNOSIS — R7302 Impaired glucose tolerance (oral): Secondary | ICD-10-CM | POA: Diagnosis not present

## 2017-10-22 DIAGNOSIS — I10 Essential (primary) hypertension: Secondary | ICD-10-CM | POA: Diagnosis not present

## 2017-10-22 DIAGNOSIS — E559 Vitamin D deficiency, unspecified: Secondary | ICD-10-CM | POA: Diagnosis not present

## 2017-10-22 DIAGNOSIS — E782 Mixed hyperlipidemia: Secondary | ICD-10-CM | POA: Diagnosis not present

## 2017-10-24 LAB — COMPLETE METABOLIC PANEL WITH GFR
AG Ratio: 1.3 (calc) (ref 1.0–2.5)
ALT: 14 U/L (ref 6–29)
AST: 17 U/L (ref 10–35)
Albumin: 4.3 g/dL (ref 3.6–5.1)
Alkaline phosphatase (APISO): 86 U/L (ref 33–130)
BUN: 13 mg/dL (ref 7–25)
CALCIUM: 10 mg/dL (ref 8.6–10.4)
CO2: 32 mmol/L (ref 20–32)
CREATININE: 0.74 mg/dL (ref 0.50–0.99)
Chloride: 102 mmol/L (ref 98–110)
GFR, EST AFRICAN AMERICAN: 96 mL/min/{1.73_m2} (ref >=60)
GFR, EST NON AFRICAN AMERICAN: 83 mL/min/{1.73_m2} (ref >=60)
GLUCOSE: 100 mg/dL — AB (ref 65–99)
Globulin: 3.3 g/dL (calc) (ref 1.9–3.7)
Potassium: 4.7 mmol/L (ref 3.5–5.3)
Sodium: 140 mmol/L (ref 135–146)
TOTAL PROTEIN: 7.6 g/dL (ref 6.1–8.1)
Total Bilirubin: 0.3 mg/dL (ref 0.2–1.2)

## 2017-10-24 LAB — TSH: TSH: 1.02 m[IU]/L (ref 0.40–4.50)

## 2017-10-24 LAB — CBC
HEMATOCRIT: 41.1 % (ref 35.0–45.0)
HEMOGLOBIN: 13.3 g/dL (ref 11.7–15.5)
MCH: 28.1 pg (ref 27.0–33.0)
MCHC: 32.4 g/dL (ref 32.0–36.0)
MCV: 86.7 fL (ref 80.0–100.0)
MPV: 11.7 fL (ref 7.5–12.5)
Platelets: 344 10*3/uL (ref 140–400)
RBC: 4.74 10*6/uL (ref 3.80–5.10)
RDW: 12.2 % (ref 11.0–15.0)
WBC: 7.8 10*3/uL (ref 3.8–10.8)

## 2017-10-24 LAB — LIPID PANEL
Cholesterol: 169 mg/dL (ref <200)
HDL: 54 mg/dL (ref >50)
LDL Cholesterol (Calc): 98 mg/dL (calc)
Non-HDL Cholesterol (Calc): 115 mg/dL (calc) (ref <130)
Total CHOL/HDL Ratio: 3.1 (calc) (ref <5.0)
Triglycerides: 80 mg/dL (ref <150)

## 2017-10-24 LAB — HEMOGLOBIN A1C
HEMOGLOBIN A1C: 6 %{Hb} — AB (ref <5.7)
Mean Plasma Glucose: 126 (calc)
eAG (mmol/L): 7 (calc)

## 2017-10-24 LAB — VITAMIN D 25 HYDROXY (VIT D DEFICIENCY, FRACTURES): Vit D, 25-Hydroxy: 22 ng/mL — ABNORMAL LOW (ref 30–100)

## 2017-10-25 ENCOUNTER — Encounter: Payer: Self-pay | Admitting: Family Medicine

## 2017-10-25 ENCOUNTER — Ambulatory Visit (INDEPENDENT_AMBULATORY_CARE_PROVIDER_SITE_OTHER): Payer: Medicare HMO | Admitting: Family Medicine

## 2017-10-25 VITALS — BP 120/80 | HR 83 | Resp 16 | Ht 66.0 in | Wt 206.0 lb

## 2017-10-25 DIAGNOSIS — R05 Cough: Secondary | ICD-10-CM

## 2017-10-25 DIAGNOSIS — E559 Vitamin D deficiency, unspecified: Secondary | ICD-10-CM

## 2017-10-25 DIAGNOSIS — F17219 Nicotine dependence, cigarettes, with unspecified nicotine-induced disorders: Secondary | ICD-10-CM

## 2017-10-25 DIAGNOSIS — Z1211 Encounter for screening for malignant neoplasm of colon: Secondary | ICD-10-CM

## 2017-10-25 DIAGNOSIS — Z0000 Encounter for general adult medical examination without abnormal findings: Secondary | ICD-10-CM

## 2017-10-25 DIAGNOSIS — R058 Other specified cough: Secondary | ICD-10-CM

## 2017-10-25 LAB — POC HEMOCCULT BLD/STL (OFFICE/1-CARD/DIAGNOSTIC): FECAL OCCULT BLD: NEGATIVE

## 2017-10-25 MED ORDER — PHENTERMINE HCL 37.5 MG PO TABS: 37.5000 mg | ORAL_TABLET | Freq: Every day | ORAL | 1 refills | Status: DC

## 2017-10-25 MED ORDER — BENZONATATE 100 MG PO CAPS: 100.0000 mg | ORAL_CAPSULE | Freq: Two times a day (BID) | ORAL | 0 refills | Status: DC | PRN

## 2017-10-25 MED ORDER — SIMVASTATIN 40 MG PO TABS: ORAL_TABLET | ORAL | 1 refills | Status: DC

## 2017-10-25 MED ORDER — LOSARTAN POTASSIUM-HCTZ 100-25 MG PO TABS: 1.0000 | ORAL_TABLET | Freq: Every day | ORAL | 1 refills | Status: DC

## 2017-10-25 MED ORDER — METFORMIN HCL 500 MG PO TABS: 500.0000 mg | ORAL_TABLET | Freq: Every day | ORAL | 1 refills | Status: DC

## 2017-10-25 NOTE — Progress Notes (Signed)
    Amanda Simon     MRN: [id number]      DOB: [birth year]  HPI: Patient is in for annual physical exam. C/o dry cough, unfortunately has resumed smoking , states she is stressed because of her aging dog.Cough x 1 week, no fever or chills Recent labs,  are reviewed.Needs to resume daily vitamin D3  Immunization is reviewed , and  Is up to date   PE: BP 120/80   Pulse 83   Resp 16   Ht 5\' 6"  (1.676 m)   Wt 206 lb (93.4 kg)   SpO2 95%   BMI 33.25 kg/m   Pleasant  female, alert and oriented x 3, in no cardio-pulmonary distress. Afebrile. HEENT No facial trauma or asymetry. Sinuses non tender.  Extra occullar muscles intact External ears normal, tympanic membranes clear. Oropharynx moist, no exudate. Neck: supple, no adenopathy,JVD or thyromegaly.No bruits.  Chest: Decreased though adequate air entry, few crackles, no wheezes Decreased though adequate air entry  Non tender to palpation  Breast: No asymetry,no masses or lumps. No tenderness. No nipple discharge or inversion. No axillary or supraclavicular adenopathy  Cardiovascular system; Heart sounds normal,  S1 and  S2 ,no S3.  No murmur, or thrill. Apical beat not displaced Peripheral pulses normal.  Abdomen: Soft, non tender, no organomegaly or masses. No bruits. Bowel sounds normal. No guarding, tenderness or rebound.  Rectal:  Normal sphincter tone. No rectal mass. Guaiac negative stool.  GU: Not examined/ no pelvic complaint  Musculoskeletal exam: Decreased though adequate  ROM of spine, hips , shoulders and knees. No deformity ,swelling or crepitus noted. No muscle wasting or atrophy.   Neurologic: Cranial nerves 2 to 12 intact. Power, tone ,sensation and reflexes normal throughout. No disturbance in gait. No tremor.  Skin: Intact, no ulceration, erythema , scaling or rash noted. Pigmentation normal throughout  Psych; Normal mood and affect. Judgement and concentration  normal   Assessment & Plan:  Annual physical exam Annual exam as documented. Counseling done  re healthy lifestyle involving commitment to 150 minutes exercise per week, heart healthy diet, and attaining healthy weight.The importance of adequate sleep also discussed. Regular seat belt use and home safety, is also discussed. Changes in health habits are decided on by the patient with goals and time frames  set for achieving them. Immunization and cancer screening needs are specifically addressed at this visit.   Cigarette nicotine dependence with nicotine-induced disorder Asked and confirms current Nicotine use of 3 to 5 cigarettes daily Assess: willingness to quit:currently not willing , states feels stressed re aging dog  Advised re her need to quit due to the fact that she already has precancerous colon polyps and hypertension and risk factors winch increase her risk of heart disease and stroke and today is presenting with a dry cough which is undoubtedly worsened by her use of nicotine Arranged follow up in 4 months. ASSist:Also made her aware of community resources including quit l line for help with quitting 5 minutes spent  In counseling     Allergic cough Tessalon perles prescribed  Vitamin D deficiency Start OTC vitamins D3 2000 IU once daily

## 2017-10-25 NOTE — Patient Instructions (Addendum)
Wellness with nurse in September, call if you need me before  F/U in 4 months , start half phentermine daily although script says one daily  Goal of 6 to 8 pounds  Congrats on excellent blood pressure and excellent labs, no change in medication  Please drink water early in the day so you don't get up at night to go to the bathroom  Please QUIT smoking entirely, Need to QUIT  Medication sent in for chest congestion  Start OTC vitamin D3 2000 IU daily

## 2017-10-28 ENCOUNTER — Encounter: Payer: Self-pay | Admitting: Family Medicine

## 2017-10-28 DIAGNOSIS — R05 Cough: Secondary | ICD-10-CM | POA: Insufficient documentation

## 2017-10-28 DIAGNOSIS — R058 Other specified cough: Secondary | ICD-10-CM | POA: Insufficient documentation

## 2017-10-28 NOTE — Assessment & Plan Note (Signed)
Tessalon perles prescribed 

## 2017-10-28 NOTE — Assessment & Plan Note (Signed)
Start OTC vitamins D3 2000 IU once daily

## 2017-10-28 NOTE — Assessment & Plan Note (Signed)

## 2017-10-28 NOTE — Assessment & Plan Note (Signed)
Asked and confirms current Nicotine use of 3 to 5 cigarettes daily Assess: willingness to quit:currently not willing , states feels stressed re aging dog  Advised re her need to quit due to the fact that she already has precancerous colon polyps and hypertension and risk factors winch increase her risk of heart disease and stroke and today is presenting with a dry cough which is undoubtedly worsened by her use of nicotine Arranged follow up in 4 months. ASSist:Also made her aware of community resources including quit l line for help with quitting 5 minutes spent  In counseling

## 2018-02-15 ENCOUNTER — Emergency Department (HOSPITAL_COMMUNITY)
Admission: EM | Admit: 2018-02-15 | Discharge: 2018-02-15 | Disposition: A | Discharge status: Home / Self Care | Payer: Medicare HMO | Attending: Emergency Medicine | Admitting: Emergency Medicine

## 2018-02-15 ENCOUNTER — Encounter (HOSPITAL_COMMUNITY): Payer: Self-pay

## 2018-02-15 DIAGNOSIS — R220 Localized swelling, mass and lump, head: Secondary | ICD-10-CM

## 2018-02-15 DIAGNOSIS — Z7982 Long term (current) use of aspirin: Secondary | ICD-10-CM | POA: Insufficient documentation

## 2018-02-15 DIAGNOSIS — Z79899 Other long term (current) drug therapy: Secondary | ICD-10-CM | POA: Insufficient documentation

## 2018-02-15 DIAGNOSIS — Z7984 Long term (current) use of oral hypoglycemic drugs: Secondary | ICD-10-CM | POA: Diagnosis not present

## 2018-02-15 DIAGNOSIS — K0889 Other specified disorders of teeth and supporting structures: Secondary | ICD-10-CM | POA: Insufficient documentation

## 2018-02-15 DIAGNOSIS — I10 Essential (primary) hypertension: Secondary | ICD-10-CM | POA: Diagnosis not present

## 2018-02-15 DIAGNOSIS — F17210 Nicotine dependence, cigarettes, uncomplicated: Secondary | ICD-10-CM | POA: Insufficient documentation

## 2018-02-15 MED ORDER — DEXAMETHASONE SODIUM PHOSPHATE 10 MG/ML IJ SOLN
10.0000 mg | Freq: Once | INTRAMUSCULAR | Status: AC
  Administered 2018-02-15: 10 mg via INTRAMUSCULAR
  Filled 2018-02-15: qty 1

## 2018-02-15 MED ORDER — PENICILLIN V POTASSIUM 500 MG PO TABS
500.0000 mg | ORAL_TABLET | Freq: Once | ORAL | Status: AC
  Administered 2018-02-15: 500 mg via ORAL
  Filled 2018-02-15: qty 1

## 2018-02-15 MED ORDER — PENICILLIN V POTASSIUM 500 MG PO TABS: 500.0000 mg | ORAL_TABLET | Freq: Four times a day (QID) | ORAL | 0 refills | Status: DC

## 2018-02-15 NOTE — ED Provider Notes (Signed)
[facility] DEPT Provider Note: Amanda Spurling, MD, FACEP  CSN: [id number] MRN: [id number] ARRIVAL: 02/15/18 at [place]: [id number]   CHIEF COMPLAINT  Medication Reaction   HISTORY OF PRESENT ILLNESS  02/15/18 6:14 AM Amanda Simon is a 67 y.o. female with multiple missing teeth who wears partial plates.  She is here with pain and a left lower second premolar and a left upper premolar for the past several days.  She recently used over-the-counter Orajel on the painful teeth and has subsequently developed swelling of her left cheek.  The pain in her teeth varies from about a 4 out of 10 to a 6 out of 10.  She has been taking Aleve with partial relief of the pain.  The swelling itself is not significantly painful, does not itch and is not erythematous.   Past Medical History:  Diagnosis Date  . Depression   . Hyperlipidemia   . Hypertension   . IGT (impaired glucose tolerance) 2014  . Obesity   . Osteopenia     Past Surgical History:  Procedure Laterality Date  . ABDOMINAL HYSTERECTOMY  1975   fibroids  . COLONOSCOPY N/A 02/20/2015   Procedure: COLONOSCOPY;  Surgeon: Rogene Houston, MD;  Location: [facility];  Service: Endoscopy;  Laterality: N/A;  930  . PARTIAL HYSTERECTOMY    . TUBAL LIGATION      Family History  Problem Relation Age of Onset  . Lung cancer Mother   . Aneurysm Father        brain   . Hypertension Sister   . Hypertension Sister     Social History   Tobacco Use  . Smoking status: Current Some Day Smoker    Packs/day: 0.25    Years: 35.00    Pack years: 8.75    Types: Cigarettes    Last attempt to quit: 03/01/2017    Years since quitting: 0.9  . Smokeless tobacco: Never Used  Substance Use Topics  . Alcohol use: No    Alcohol/week: 0.0 oz  . Drug use: No    Prior to Admission medications   Medication Sig Start Date End Date Taking? Authorizing Provider  aspirin 81 MG chewable tablet Chew 81 mg by mouth daily.   Yes [provider]  cholecalciferol (VITAMIN D) 400 units TABS tablet Take 400 Units by mouth daily.   Yes [provider]  losartan-hydrochlorothiazide (HYZAAR) 100-25 MG tablet Take 1 tablet by mouth daily. 10/25/17  Yes Fayrene Helper, MD  metFORMIN (GLUCOPHAGE) 500 MG tablet Take 1 tablet (500 mg total) by mouth daily with breakfast. 10/25/17  Yes Fayrene Helper, MD  simvastatin (ZOCOR) 40 MG tablet TAKE 1 TABLET (40 MG TOTAL) BY MOUTH AT BEDTIME. 10/25/17  Yes Fayrene Helper, MD  benzonatate (TESSALON) 100 MG capsule Take 1 capsule (100 mg total) by mouth 2 (two) times daily as needed for cough. Patient not taking: Reported on 02/15/2018 10/25/17   Fayrene Helper, MD  phentermine (ADIPEX-P) 37.5 MG tablet Take 1 tablet (37.5 mg total) by mouth daily before breakfast. Patient not taking: Reported on 02/15/2018 10/25/17   Fayrene Helper, MD    Allergies Metformin and related   REVIEW OF SYSTEMS  Negative except as noted here or in the History of Present Illness.   PHYSICAL EXAMINATION  Initial Vital Signs Blood pressure 138/75, pulse 94, temperature 97.6 F (36.4 C), temperature source Oral, resp. rate 20, SpO2 98 %.  Examination General: Well-developed, well-nourished  female in no acute distress; appearance consistent with age of record HENT: normocephalic; atraumatic; poor dentition with multiple missing teeth; left lower second premolar and left upper premolar with caries and tenderness to percussion; edema and mild tenderness of left cheek without erythema, induration or fluctuance Eyes: Normal appearance Neck: supple; no lymphadenopathy Heart: regular rate and rhythm Lungs: clear to auscultation bilaterally Abdomen: soft; nondistended; nontender; bowel sounds present Extremities: No deformity; full range of motion Neurologic: Awake, alert and oriented; motor function intact in all extremities and symmetric; no facial droop Skin: Warm and dry Psychiatric:  Normal mood and affect   RESULTS  Summary of this visit's results, reviewed by myself:   EKG Interpretation  Date/Time:    Ventricular Rate:    PR Interval:    QRS Duration:   QT Interval:    QTC Calculation:   R Axis:     Text Interpretation:        Laboratory Studies: No results found for this or any previous visit (from the past 24 hour(s)). Imaging Studies: No results found.  ED COURSE and MDM  Nursing notes and initial vitals signs, including pulse oximetry, reviewed.  Vitals:   02/15/18 0256 02/15/18 0620  BP: 138/75 135/85  Pulse: 94 81  Resp: 20 16  Temp: 97.6 F (36.4 C)   TempSrc: Oral   SpO2: 98% 100%   The patient swelling may be due to an odontogenic soft tissue infection but there is no erythema, warmth or induration and tenderness is minimal.  This may represent an allergic reaction to the Orajel.  She was advised to discontinue the Orajel.  We will give her a dose of steroids and start her on penicillin.  She has a Pharmacist, community with whom she can follow-up.  PROCEDURES    ED DIAGNOSES     ICD-10-CM   1. Pain, dental K08.89   2. Left facial swelling R22.0        Kathern Lobosco, Jenny Reichmann, MD 02/15/18 [phone]

## 2018-02-15 NOTE — ED Triage Notes (Signed)
Pt states that she had a toothache and used a generic oral gel and her left jaw became swollen

## 2018-02-21 ENCOUNTER — Encounter: Payer: Self-pay | Admitting: Family Medicine

## 2018-02-21 ENCOUNTER — Ambulatory Visit (INDEPENDENT_AMBULATORY_CARE_PROVIDER_SITE_OTHER): Payer: Medicare HMO | Admitting: Family Medicine

## 2018-02-21 ENCOUNTER — Telehealth: Payer: Self-pay | Admitting: Family Medicine

## 2018-02-21 VITALS — BP 124/80 | HR 80 | Resp 16 | Ht 66.0 in | Wt 196.1 lb

## 2018-02-21 DIAGNOSIS — E782 Mixed hyperlipidemia: Secondary | ICD-10-CM | POA: Diagnosis not present

## 2018-02-21 DIAGNOSIS — F17219 Nicotine dependence, cigarettes, with unspecified nicotine-induced disorders: Secondary | ICD-10-CM

## 2018-02-21 DIAGNOSIS — R7302 Impaired glucose tolerance (oral): Secondary | ICD-10-CM | POA: Diagnosis not present

## 2018-02-21 DIAGNOSIS — I10 Essential (primary) hypertension: Secondary | ICD-10-CM

## 2018-02-21 DIAGNOSIS — E669 Obesity, unspecified: Secondary | ICD-10-CM | POA: Diagnosis not present

## 2018-02-21 DIAGNOSIS — Z1231 Encounter for screening mammogram for malignant neoplasm of breast: Secondary | ICD-10-CM | POA: Diagnosis not present

## 2018-02-21 LAB — BASIC METABOLIC PANEL WITH GFR
BUN: 10 mg/dL (ref 7–25)
CALCIUM: 9.6 mg/dL (ref 8.6–10.4)
CO2: 30 mmol/L (ref 20–32)
CREATININE: 0.68 mg/dL (ref 0.50–0.99)
Chloride: 104 mmol/L (ref 98–110)
GFR, EST NON AFRICAN AMERICAN: 90 mL/min/{1.73_m2} (ref >=60)
GFR, Est African American: 104 mL/min/{1.73_m2} (ref >=60)
Glucose, Bld: 92 mg/dL (ref 65–99)
Potassium: 4.9 mmol/L (ref 3.5–5.3)
Sodium: 141 mmol/L (ref 135–146)

## 2018-02-21 MED ORDER — METFORMIN HCL 500 MG PO TABS: ORAL_TABLET | ORAL | 0 refills | Status: DC

## 2018-02-21 MED ORDER — PHENTERMINE HCL 37.5 MG PO TABS: 37.5000 mg | ORAL_TABLET | Freq: Every day | ORAL | 0 refills | Status: DC

## 2018-02-21 NOTE — Progress Notes (Signed)
Amanda Simon     MRN: [id number]      DOB: 1951/01/14   HPI [name] is here for follow up and re-evaluation of chronic medical conditions, medication management and review of any available recent lab and radiology data.  Preventive health is updated, specifically  Cancer screening and Immunization.The PT denies any adverse reactions to current medications since the last visit.  ED visit on 5/29 due to dental pain, still taking antibiotic doing well Cut back on cigarettes did not smoke for 5 weeks, now about 3 / day Has managed to lose 10 pounds with lifestyle change and the help of phentermine  ROS Denies recent fever or chills. Denies sinus pressure, nasal congestion, ear pain or sore throat. Denies chest congestion, productive cough or wheezing. Denies chest pains, palpitations and leg swelling Denies abdominal pain, nausea, vomiting,diarrhea or constipation.   Denies dysuria, frequency, hesitancy or incontinence. Denies joint pain, swelling and limitation in mobility. Denies headaches, seizures, numbness, or tingling. Denies depression, anxiety or insomnia. Denies skin break down or rash.   PE  BP 124/80   Pulse 80   Resp 16   Ht 5\' 6"  (1.676 m)   Wt 196 lb 1.9 oz (89 kg)   SpO2 96%   BMI 31.65 kg/m   Patient alert and oriented and in no cardiopulmonary distress.  HEENT: No facial asymmetry, EOMI,   oropharynx pink and moist.  Neck supple no JVD, no mass.  Chest: Clear to auscultation bilaterally.  CVS: S1, S2 no murmurs, no S3.Regular rate.  ABD: Soft non tender.   Ext: No edema  MS: Adequate ROM spine, shoulders, hips and knees.  Skin: Intact, no ulcerations or rash noted.  Psych: Good eye contact, normal affect. Memory intact not anxious or depressed appearing.  CNS: CN 2-12 intact, power,  normal throughout.no focal deficits noted.   Assessment & Plan  Essential hypertension Controlled, no change in medication DASH diet and commitment to  daily physical activity for a minimum of 30 minutes discussed and encouraged, as a part of hypertension management. The importance of attaining a healthy weight is also discussed.  BP/Weight 02/21/2018 02/15/2018 10/25/2017 06/29/2017 04/20/2017 10/21/2016 95/62/1308  Systolic BP 657 846 962 952 841 324 401  Diastolic BP 80 85 80 74 82 78 78  Wt. (Lbs) 196.12 - 206 207.04 208 204 200  BMI 31.65 - 33.25 33.42 33.57 32.93 32.28       Hyperlipemia Controlled, no change in medication Hyperlipidemia:Low fat diet discussed and encouraged.   Lipid Panel  Lab Results  Component Value Date   CHOL 169 10/22/2017   HDL 54 10/22/2017   LDLCALC 98 10/22/2017   TRIG 80 10/22/2017   CHOLHDL 3.1 10/22/2017       IGT (impaired glucose tolerance) Patient educated about the importance of limiting  Carbohydrate intake , the need to commit to daily physical activity for a minimum of 30 minutes , and to commit weight loss. The fact that changes in all these areas will reduce or eliminate all together the development of diabetes is stressed.  Improved Diabetic Labs Latest Ref Rng & Units 02/21/2018 10/22/2017 03/25/2017 10/13/2016 08/16/2016  HbA1c <5.7 % of total Hgb - 6.0(H) 6.2(H) 5.7(H) 5.8(H)  Chol <200 mg/dL - 169 162 172 166  HDL >50 mg/dL - 54 53 51 50(L)  Calc LDL mg/dL (calc) - 98 94 104(H) 104(H)  Triglycerides <150 mg/dL - 80 74 87 62  Creatinine 0.50 - 0.99 mg/dL 0.68 0.74  0.72 0.78 0.73   BP/Weight 02/21/2018 02/15/2018 10/25/2017 06/29/2017 04/20/2017 10/21/2016 27/78/2423  Systolic BP 536 144 315 400 867 619 509  Diastolic BP 80 85 80 74 82 78 78  Wt. (Lbs) 196.12 - 206 207.04 208 204 200  BMI 31.65 - 33.25 33.42 33.57 32.93 32.28   No flowsheet data found.    Obesity (BMI 30.0-34.9) Improved, phentermine , 30 tabs prscribed to be stretched out over a 2 to 3 month period. Patient re-educated about  the importance of commitment to a  minimum of 150 minutes of exercise per week.  The importance  of healthy food choices with portion control discussed. Encouraged to start a food diary, count calories and to consider  joining a support group. Sample diet sheets offered. Goals set by the patient for the next several months.   Weight /BMI 02/21/2018 10/25/2017 06/29/2017  WEIGHT 196 lb 1.9 oz 206 lb 207 lb 0.6 oz  HEIGHT 5\' 6"  5\' 6"  5\' 6"   BMI 31.65 kg/m2 33.25 kg/m2 33.42 kg/m2      Cigarette nicotine dependence with nicotine-induced disorder Asked: confirm 2ciggs / day Assess: wants to quit Advise: needs to quit to reduce lung damage, cancer risk , also risk of stroke and heart disease Assist: counseled for 5 mins and literature provided Ar rang; f/u in 5 monhts

## 2018-02-21 NOTE — Patient Instructions (Addendum)
Please schedule mammogram at [facility] July 7 or after  F/U in early December call if you need me before  Chem 7 and EGFR  Today  Fasting lipid, cmp and EGFr, HBA1C  Congrats on weight loss and reducing cigarettes to avg of 2  Per day.                     !!! Steps to Quit Smoking Smoking tobacco can be bad for your health. It can also affect almost every organ in your body. Smoking puts you and people around you at risk for many serious long-lasting (chronic) diseases. Quitting smoking is hard, but it is one of the best things that you can do for your health. It is never too late to quit. What are the benefits of quitting smoking? When you quit smoking, you lower your risk for getting serious diseases and conditions. They can include:  Lung cancer or lung disease.  Heart disease.  Stroke.  Heart attack.  Not being able to have children (infertility).  Weak bones (osteoporosis) and broken bones (fractures).  If you have coughing, wheezing, and shortness of breath, those symptoms may get better when you quit. You may also get sick less often. If you are pregnant, quitting smoking can help to lower your chances of having a baby of low birth weight. What can I do to help me quit smoking? Talk with your doctor about what can help you quit smoking. Some things you can do (strategies) include:  Quitting smoking totally, instead of slowly cutting back how much you smoke over a period of time.  Going to in-person counseling. You are more likely to quit if you go to many counseling sessions.  Using resources and support systems, such as: ? Database administrator with a Social worker. ? Phone quitlines. ? Careers information officer. ? Support groups or group counseling. ? Text messaging programs. ? Mobile phone apps or applications.  Taking medicines. Some of these medicines may have nicotine in them. If you are pregnant or breastfeeding, do not take any medicines to quit smoking  unless your doctor says it is okay. Talk with your doctor about counseling or other things that can help you.  Talk with your doctor about using more than one strategy at the same time, such as taking medicines while you are also going to in-person counseling. This can help make quitting easier. What things can I do to make it easier to quit? Quitting smoking might feel very hard at first, but there is a lot that you can do to make it easier. Take these steps:  Talk to your family and friends. Ask them to support and encourage you.  Call phone quitlines, reach out to support groups, or work with a Social worker.  Ask people who smoke to not smoke around you.  Avoid places that make you want (trigger) to smoke, such as: ? Bars. ? Parties. ? Smoke-break areas at work.  Spend time with people who do not smoke.  Lower the stress in your life. Stress can make you want to smoke. Try these things to help your stress: ? Getting regular exercise. ? Deep-breathing exercises. ? Yoga. ? Meditating. ? Doing a body scan. To do this, close your eyes, focus on one area of your body at a time from head to toe, and notice which parts of your body are tense. Try to relax the muscles in those areas.  Download or buy apps on your mobile phone or tablet  that can help you stick to your quit plan. There are many free apps, such as QuitGuide from the [employer] Office manager for Disease Control and Prevention). You can find more support from smokefree.gov and other websites.  This information is not intended to replace advice given to you by your health care provider. Make sure you discuss any questions you have with your health care provider. Document Released: 07/03/2009 Document Revised: 05/04/2016 Document Reviewed: 01/21/2015 Elsevier Interactive Patient Education  2018 [employer].

## 2018-03-03 ENCOUNTER — Encounter: Payer: Self-pay | Admitting: Family Medicine

## 2018-03-03 NOTE — Assessment & Plan Note (Signed)
Controlled, no change in medication Hyperlipidemia:Low fat diet discussed and encouraged.   Lipid Panel  Lab Results  Component Value Date   CHOL 169 10/22/2017   HDL 54 10/22/2017   LDLCALC 98 10/22/2017   TRIG 80 10/22/2017   CHOLHDL 3.1 10/22/2017

## 2018-03-03 NOTE — Assessment & Plan Note (Signed)
Patient educated about the importance of limiting  Carbohydrate intake , the need to commit to daily physical activity for a minimum of 30 minutes , and to commit weight loss. The fact that changes in all these areas will reduce or eliminate all together the development of diabetes is stressed.  Improved Diabetic Labs Latest Ref Rng & Units 02/21/2018 10/22/2017 03/25/2017 10/13/2016 08/16/2016  HbA1c <5.7 % of total Hgb - 6.0(H) 6.2(H) 5.7(H) 5.8(H)  Chol <200 mg/dL - 169 162 172 166  HDL >50 mg/dL - 54 53 51 50(L)  Calc LDL mg/dL (calc) - 98 94 104(H) 104(H)  Triglycerides <150 mg/dL - 80 74 87 62  Creatinine 0.50 - 0.99 mg/dL 0.68 0.74 0.72 0.78 0.73   BP/Weight 02/21/2018 02/15/2018 10/25/2017 06/29/2017 04/20/2017 10/21/2016 54/62/7035  Systolic BP 009 381 829 937 169 678 938  Diastolic BP 80 85 80 74 82 78 78  Wt. (Lbs) 196.12 - 206 207.04 208 204 200  BMI 31.65 - 33.25 33.42 33.57 32.93 32.28   No flowsheet data found.

## 2018-03-03 NOTE — Assessment & Plan Note (Signed)
Asked: confirm 2ciggs / day Assess: wants to quit Advise: needs to quit to reduce lung damage, cancer risk , also risk of stroke and heart disease Assist: counseled for 5 mins and literature provided Ar rang; f/u in 5 monhts

## 2018-03-03 NOTE — Assessment & Plan Note (Signed)
Improved, phentermine , 30 tabs prscribed to be stretched out over a 2 to 3 month period. Patient re-educated about  the importance of commitment to a  minimum of 150 minutes of exercise per week.  The importance of healthy food choices with portion control discussed. Encouraged to start a food diary, count calories and to consider  joining a support group. Sample diet sheets offered. Goals set by the patient for the next several months.   Weight /BMI 02/21/2018 10/25/2017 06/29/2017  WEIGHT 196 lb 1.9 oz 206 lb 207 lb 0.6 oz  HEIGHT 5\' 6"  5\' 6"  5\' 6"   BMI 31.65 kg/m2 33.25 kg/m2 33.42 kg/m2

## 2018-03-03 NOTE — Assessment & Plan Note (Signed)
Controlled, no change in medication DASH diet and commitment to daily physical activity for a minimum of 30 minutes discussed and encouraged, as a part of hypertension management. The importance of attaining a healthy weight is also discussed.  BP/Weight 02/21/2018 02/15/2018 10/25/2017 06/29/2017 04/20/2017 10/21/2016 66/59/9357  Systolic BP 017 793 903 009 233 007 622  Diastolic BP 80 85 80 74 82 78 78  Wt. (Lbs) 196.12 - 206 207.04 208 204 200  BMI 31.65 - 33.25 33.42 33.57 32.93 32.28

## 2018-03-27 ENCOUNTER — Ambulatory Visit (HOSPITAL_COMMUNITY): Payer: Medicare HMO

## 2018-03-27 ENCOUNTER — Other Ambulatory Visit: Payer: Self-pay | Admitting: Family Medicine

## 2018-04-22 ENCOUNTER — Other Ambulatory Visit: Payer: Self-pay | Admitting: Family Medicine

## 2018-05-17 ENCOUNTER — Other Ambulatory Visit: Payer: Self-pay | Admitting: Family Medicine

## 2018-06-05 ENCOUNTER — Ambulatory Visit: Payer: Medicare HMO

## 2018-08-11 ENCOUNTER — Telehealth: Payer: Self-pay

## 2018-08-11 DIAGNOSIS — I10 Essential (primary) hypertension: Secondary | ICD-10-CM

## 2018-08-11 DIAGNOSIS — R7302 Impaired glucose tolerance (oral): Secondary | ICD-10-CM

## 2018-08-15 DIAGNOSIS — E785 Hyperlipidemia, unspecified: Secondary | ICD-10-CM | POA: Diagnosis not present

## 2018-08-15 DIAGNOSIS — R7302 Impaired glucose tolerance (oral): Secondary | ICD-10-CM | POA: Diagnosis not present

## 2018-08-15 DIAGNOSIS — I10 Essential (primary) hypertension: Secondary | ICD-10-CM | POA: Diagnosis not present

## 2018-08-16 ENCOUNTER — Other Ambulatory Visit: Payer: Self-pay | Admitting: Family Medicine

## 2018-08-17 LAB — COMPLETE METABOLIC PANEL WITH GFR
AG RATIO: 1.4 (calc) (ref 1.0–2.5)
ALBUMIN MSPROF: 4.2 g/dL (ref 3.6–5.1)
ALT: 12 U/L (ref 6–29)
AST: 14 U/L (ref 10–35)
Alkaline phosphatase (APISO): 92 U/L (ref 33–130)
BUN: 13 mg/dL (ref 7–25)
CALCIUM: 9.5 mg/dL (ref 8.6–10.4)
CO2: 31 mmol/L (ref 20–32)
Chloride: 105 mmol/L (ref 98–110)
Creat: 0.67 mg/dL (ref 0.50–0.99)
GFR, EST AFRICAN AMERICAN: 104 mL/min/{1.73_m2} (ref >=60)
GFR, EST NON AFRICAN AMERICAN: 90 mL/min/{1.73_m2} (ref >=60)
Globulin: 3.1 g/dL (calc) (ref 1.9–3.7)
Glucose, Bld: 87 mg/dL (ref 65–99)
Potassium: 4.4 mmol/L (ref 3.5–5.3)
SODIUM: 142 mmol/L (ref 135–146)
TOTAL PROTEIN: 7.3 g/dL (ref 6.1–8.1)
Total Bilirubin: 0.6 mg/dL (ref 0.2–1.2)

## 2018-08-17 LAB — LIPID PANEL
CHOLESTEROL: 208 mg/dL — AB (ref <200)
HDL: 59 mg/dL (ref >50)
LDL CHOLESTEROL (CALC): 133 mg/dL — AB
Non-HDL Cholesterol (Calc): 149 mg/dL (calc) — ABNORMAL HIGH (ref <130)
TRIGLYCERIDES: 66 mg/dL (ref <150)
Total CHOL/HDL Ratio: 3.5 (calc) (ref <5.0)

## 2018-08-17 LAB — HEMOGLOBIN A1C
Hgb A1c MFr Bld: 6.2 % of total Hgb — ABNORMAL HIGH (ref <5.7)
Mean Plasma Glucose: 131 (calc)
eAG (mmol/L): 7.3 (calc)

## 2018-08-18 ENCOUNTER — Other Ambulatory Visit: Payer: Self-pay | Admitting: Family Medicine

## 2018-08-24 ENCOUNTER — Ambulatory Visit (INDEPENDENT_AMBULATORY_CARE_PROVIDER_SITE_OTHER): Payer: Medicare HMO | Admitting: Family Medicine

## 2018-08-24 ENCOUNTER — Telehealth: Payer: Self-pay

## 2018-08-24 ENCOUNTER — Encounter: Payer: Self-pay | Admitting: Family Medicine

## 2018-08-24 ENCOUNTER — Other Ambulatory Visit: Payer: Self-pay | Admitting: Family Medicine

## 2018-08-24 VITALS — BP 122/78 | HR 88 | Resp 15 | Ht 66.0 in | Wt 206.0 lb

## 2018-08-24 DIAGNOSIS — Z23 Encounter for immunization: Secondary | ICD-10-CM

## 2018-08-24 DIAGNOSIS — E559 Vitamin D deficiency, unspecified: Secondary | ICD-10-CM | POA: Diagnosis not present

## 2018-08-24 DIAGNOSIS — R7302 Impaired glucose tolerance (oral): Secondary | ICD-10-CM

## 2018-08-24 DIAGNOSIS — F321 Major depressive disorder, single episode, moderate: Secondary | ICD-10-CM | POA: Diagnosis not present

## 2018-08-24 DIAGNOSIS — F17219 Nicotine dependence, cigarettes, with unspecified nicotine-induced disorders: Secondary | ICD-10-CM | POA: Diagnosis not present

## 2018-08-24 DIAGNOSIS — I10 Essential (primary) hypertension: Secondary | ICD-10-CM | POA: Diagnosis not present

## 2018-08-24 DIAGNOSIS — E669 Obesity, unspecified: Secondary | ICD-10-CM

## 2018-08-24 DIAGNOSIS — Z1231 Encounter for screening mammogram for malignant neoplasm of breast: Secondary | ICD-10-CM

## 2018-08-24 MED ORDER — BUPROPION HCL ER (XL) 150 MG PO TB24: 150.0000 mg | ORAL_TABLET | Freq: Every day | ORAL | 5 refills | Status: DC

## 2018-08-24 NOTE — Patient Instructions (Addendum)
Physical exam with MD in 3 months, call if you need me before  Please reschedule  mammogram pt to go and schedule  New for depression and anxiety is wellbutrin and this will also help with smoking cessation  Sorry about all the stress you have been under recently. You are referred to therapist on the phone  Flu vaccine today

## 2018-08-24 NOTE — Progress Notes (Signed)
Amanda Simon     MRN: [id number]      DOB: [birth year]   HPI Amanda Simon is here for follow up and re-evaluation of chronic medical conditions, medication management and review of any available recent lab and radiology data.  Preventive health is updated, specifically  Cancer screening and Immunization.   Questions or concerns regarding consultations or procedures which the PT has had in the interim are  addressed. Stopped metformin 2 weeks ago no leg pain Notes change in urinary stream though feels this is relate to amount of urine output and is electing to watch for a while longer Has had a difficult 4 months, daughter and grand daughter were in an MVA and both sustained physical injuries, and she had to euthenize her beloved dog because of cancer Has started smoking again to her distress and is battling depression ROS Denies recent fever or chills. Denies sinus pressure, nasal congestion, ear pain or sore throat. Denies chest congestion, productive cough or wheezing. Denies chest pains, palpitations and leg swelling Denies abdominal pain, nausea, vomiting,diarrhea or constipation.   Denies dysuria, frequency, hesitancy or incontinence. Denies joint pain, swelling and limitation in mobility. Denies headaches, seizures, numbness, or tingling. . Denies skin break down or rash.   PE  BP 122/78   Pulse 88   Resp 15   Ht 5\' 6"  (1.676 m)   Wt 206 lb (93.4 kg)   SpO2 98%   BMI 33.25 kg/m   Patient alert and oriented and in no cardiopulmonary distress.  HEENT: No facial asymmetry, EOMI,   oropharynx pink and moist.  Neck supple no JVD, no mass.  Chest: Clear to auscultation bilaterally.decreased air entry  CVS: S1, S2 no murmurs, no S3.Regular rate.  ABD: Soft non tender.   Ext: No edema  MS: Adequate ROM spine, shoulders, hips and knees.  Skin: Intact, no ulcerations or rash noted.  Psych: Good eye contact, normal affect. Memory intact mildly  anxious and depressed  appearing.  CNS: CN 2-12 intact, power,  normal throughout.no focal deficits noted.   Assessment & Plan  Essential hypertension Controlled, no change in medication DASH diet and commitment to daily physical activity for a minimum of 30 minutes discussed and encouraged, as a part of hypertension management. The importance of attaining a healthy weight is also discussed.  BP/Weight 08/24/2018 02/21/2018 02/15/2018 10/25/2017 06/29/2017 03/22/5328 05/23/4267  Systolic BP 341 962 229 798 921 194 174  Diastolic BP 78 80 85 80 74 82 78  Wt. (Lbs) 206 196.12 - 206 207.04 208 204  BMI 33.25 31.65 - 33.25 33.42 33.57 32.93       Obesity (BMI 30.0-34.9) Deteriorated. Patient re-educated about  the importance of commitment to a  minimum of 150 minutes of exercise per week.  The importance of healthy food choices with portion control discussed. Encouraged to start a food diary, count calories and to consider  joining a support group. Sample diet sheets offered. Goals set by the patient for the next several months.   Weight /BMI 08/24/2018 02/21/2018 10/25/2017  WEIGHT 206 lb 196 lb 1.9 oz 206 lb  HEIGHT 5\' 6"  5\' 6"  5\' 6"   BMI 33.25 kg/m2 31.65 kg/m2 33.25 kg/m2      Cigarette nicotine dependence with nicotine-induced disorder Asked:confirms currently smokes cigarettes Assess: Unwilling to quit but cutting back Advise: needs to QUIT to reduce risk of cancer, cardio and cerebrovascular disease Assist: counseled for 5 minutes and literature provided Arrange: follow up in 3 months  Depression, major, single episode, moderate (HCC) Start Wellbutrin 150 mg daily and referred to therapy

## 2018-08-24 NOTE — Telephone Encounter (Signed)
[facility] - Left Message  

## 2018-08-27 ENCOUNTER — Encounter: Payer: Self-pay | Admitting: Family Medicine

## 2018-08-27 DIAGNOSIS — F321 Major depressive disorder, single episode, moderate: Secondary | ICD-10-CM | POA: Insufficient documentation

## 2018-08-27 HISTORY — DX: Major depressive disorder, single episode, moderate: F32.1

## 2018-08-27 NOTE — Assessment & Plan Note (Signed)
Start Wellbutrin 150 mg daily and referred to therapy

## 2018-08-27 NOTE — Assessment & Plan Note (Signed)
Asked:confirms currently smokes cigarettes Assess: Unwilling to quit but cutting back Advise: needs to QUIT to reduce risk of cancer, cardio and cerebrovascular disease Assist: counseled for 5 minutes and literature provided Arrange: follow up in 3 months  

## 2018-08-27 NOTE — Assessment & Plan Note (Signed)
Deteriorated. Patient re-educated about  the importance of commitment to a  minimum of 150 minutes of exercise per week.  The importance of healthy food choices with portion control discussed. Encouraged to start a food diary, count calories and to consider  joining a support group. Sample diet sheets offered. Goals set by the patient for the next several months.   Weight /BMI 08/24/2018 02/21/2018 10/25/2017  WEIGHT 206 lb 196 lb 1.9 oz 206 lb  HEIGHT 5\' 6"  5\' 6"  5\' 6"   BMI 33.25 kg/m2 31.65 kg/m2 33.25 kg/m2

## 2018-08-27 NOTE — Assessment & Plan Note (Signed)
Controlled, no change in medication DASH diet and commitment to daily physical activity for a minimum of 30 minutes discussed and encouraged, as a part of hypertension management. The importance of attaining a healthy weight is also discussed.  BP/Weight 08/24/2018 02/21/2018 02/15/2018 10/25/2017 06/29/2017 5/0/5697 05/25/8015  Systolic BP 553 748 270 786 754 492 010  Diastolic BP 78 80 85 80 74 82 78  Wt. (Lbs) 206 196.12 - 206 207.04 208 204  BMI 33.25 31.65 - 33.25 33.42 33.57 32.93

## 2018-08-28 ENCOUNTER — Telehealth: Payer: Self-pay

## 2018-08-28 NOTE — Telephone Encounter (Signed)
2nd attempt - [facility] .  Unable to leave a voice mail message.  Routed information to the PCP and Dr. Modesta Messing.

## 2018-09-01 ENCOUNTER — Telehealth: Payer: Self-pay

## 2018-09-01 NOTE — Telephone Encounter (Signed)
3rd attempt - Left message.  Several attempts have been made to contact patient without success. Patient will be placed on the inactive list.  If services are needed again.  Please contact [facility] at [phone].    Information will be routed to the PCP and Dr. Modesta Messing

## 2018-09-01 NOTE — Telephone Encounter (Signed)
3rd attempt - Left message.  Several attempts have been made to contact patient without success. Patient will be placed on the inactive list.  If services are needed again.  Please contact [facility] at [phone].    Information will be routed to the PCP and Dr. Modesta Messing

## 2018-09-06 ENCOUNTER — Telehealth: Payer: Self-pay | Admitting: Family Medicine

## 2018-09-06 NOTE — Telephone Encounter (Signed)
Spoke with patient and she states that after first dose or two of Bupropion she was up for 2 days. She states that it has a lot of side effects such as insomnia and dry eyes and says not to take it if you are taking any other medications unless your health care provider tells you to. She says she doesn't know why she needs the medication. I asked if she realized Ava with [facility] had tried contacting her. She stated her husband must have erased the message but she is feeling better and if she feels like she needs to talk to her she will call her back.

## 2018-09-06 NOTE — Telephone Encounter (Signed)
Pt states she has insomnia from taking Buprofen --and she wants to talk to a nurse.

## 2018-09-06 NOTE — Telephone Encounter (Signed)
Pls let her know that this is the " zyban" we discussed to help her to quit smoking , since she has more negative than positive effects from the med , I agree with her stopping it

## 2018-09-07 NOTE — Telephone Encounter (Signed)
Spoke with patient and let her know that Dr.Simpson agrees with her stopping the medication.

## 2018-09-16 ENCOUNTER — Other Ambulatory Visit: Payer: Self-pay | Admitting: Family Medicine

## 2018-09-19 ENCOUNTER — Encounter: Payer: Self-pay | Admitting: *Deleted

## 2018-10-06 ENCOUNTER — Ambulatory Visit (HOSPITAL_COMMUNITY): Payer: Medicare HMO

## 2018-10-13 ENCOUNTER — Ambulatory Visit (HOSPITAL_COMMUNITY): Payer: Medicare HMO

## 2018-11-21 ENCOUNTER — Encounter: Payer: Medicare HMO | Admitting: Family Medicine

## 2018-11-21 ENCOUNTER — Encounter: Payer: Self-pay | Admitting: *Deleted

## 2018-12-14 ENCOUNTER — Encounter: Payer: Medicare HMO | Admitting: Family Medicine

## 2019-01-04 ENCOUNTER — Telehealth: Payer: Self-pay | Admitting: *Deleted

## 2019-01-04 ENCOUNTER — Other Ambulatory Visit: Payer: Self-pay

## 2019-01-04 NOTE — Telephone Encounter (Signed)
Pt called requesting a refill on her metformin. Stated the last time she was here Dr. Moshe Cipro asked her if she needed refills and she told her no but at the time she had plenty of them now she is completely out.

## 2019-01-04 NOTE — Telephone Encounter (Signed)
This is listed on her allergy list. Called pt and requested a call back to clarify

## 2019-01-09 ENCOUNTER — Other Ambulatory Visit: Payer: Self-pay

## 2019-01-09 ENCOUNTER — Encounter: Payer: Self-pay | Admitting: Family Medicine

## 2019-01-09 ENCOUNTER — Ambulatory Visit (INDEPENDENT_AMBULATORY_CARE_PROVIDER_SITE_OTHER): Payer: Medicare HMO | Admitting: Family Medicine

## 2019-01-09 DIAGNOSIS — Z0000 Encounter for general adult medical examination without abnormal findings: Secondary | ICD-10-CM | POA: Diagnosis not present

## 2019-01-09 NOTE — Patient Instructions (Signed)
Amanda Simon , Thank you for taking time to come for your Medicare Wellness Visit. I appreciate your ongoing commitment to your health goals. Please review the following plan we discussed and let me know if I can assist you in the future.   Screening recommendations/referrals: Colonoscopy: Due in 2021  Mammogram: Due July 2020 Bone Density: Completed, can update in future if need Recommended yearly ophthalmology/optometry visit for glaucoma screening and checkup Recommended yearly dental visit for hygiene and checkup  Vaccinations: Influenza vaccine: Due Oct 2020 Pneumococcal vaccine: Completed Tdap vaccine: Due in 2022 Shingles vaccine: Completed  Preventive Care 7 Years and Older, Female Preventive care refers to lifestyle choices and visits with your health care provider that can promote health and wellness. What does preventive care include?  A yearly physical exam. This is also called an annual well check.  Dental exams once or twice a year.  Routine eye exams. Ask your health care provider how often you should have your eyes checked.  Personal lifestyle choices, including:  Daily care of your teeth and gums.  Regular physical activity.  Eating a healthy diet.  Avoiding tobacco and drug use.  Limiting alcohol use.  Practicing safe sex.  Taking low-dose aspirin every day.  Taking vitamin and mineral supplements as recommended by your health care provider. What happens during an annual well check? The services and screenings done by your health care provider during your annual well check will depend on your age, overall health, lifestyle risk factors, and family history of disease. Counseling  Your health care provider may ask you questions about your:  Alcohol use.  Tobacco use.  Drug use.  Emotional well-being.  Home and relationship well-being.  Sexual activity.  Eating habits.  History of falls.  Memory and ability to understand (cognition).   Work and work Statistician.  Reproductive health. Screening  You may have the following tests or measurements:  Height, weight, and BMI.  Blood pressure.  Lipid and cholesterol levels. These may be checked every 5 years, or more frequently if you are over 49 years old.  Skin check.  Lung cancer screening. You may have this screening every year starting at age 26 if you have a 30-pack-year history of smoking and currently smoke or have quit within the past 15 years.  Fecal occult blood test (FOBT) of the stool. You may have this test every year starting at age 19.  Flexible sigmoidoscopy or colonoscopy. You may have a sigmoidoscopy every 5 years or a colonoscopy every 10 years starting at age 22.  Hepatitis C blood test.  Hepatitis B blood test.  Sexually transmitted disease (STD) testing.  Diabetes screening. This is done by checking your blood sugar (glucose) after you have not eaten for a while (fasting). You may have this done every 1-3 years.  Bone density scan. This is done to screen for osteoporosis. You may have this done starting at age 75.  Mammogram. This may be done every 1-2 years. Talk to your health care provider about how often you should have regular mammograms. Talk with your health care provider about your test results, treatment options, and if necessary, the need for more tests. Vaccines  Your health care provider may recommend certain vaccines, such as:  Influenza vaccine. This is recommended every year.  Tetanus, diphtheria, and acellular pertussis (Tdap, Td) vaccine. You may need a Td booster every 10 years.  Zoster vaccine. You may need this after age 60.  Pneumococcal 13-valent conjugate (PCV13) vaccine. One  dose is recommended after age 35.  Pneumococcal polysaccharide (PPSV23) vaccine. One dose is recommended after age 82. Talk to your health care provider about which screenings and vaccines you need and how often you need them. This information  is not intended to replace advice given to you by your health care provider. Make sure you discuss any questions you have with your health care provider. Document Released: 10/03/2015 Document Revised: 05/26/2016 Document Reviewed: 07/08/2015 Elsevier Interactive Patient Education  2017 [place] Prevention in the Home Falls can cause injuries. They can happen to people of all ages. There are many things you can do to make your home safe and to help prevent falls. What can I do on the outside of my home?  Regularly fix the edges of walkways and driveways and fix any cracks.  Remove anything that might make you trip as you walk through a door, such as a raised step or threshold.  Trim any bushes or trees on the path to your home.  Use bright outdoor lighting.  Clear any walking paths of anything that might make someone trip, such as rocks or tools.  Regularly check to see if handrails are loose or broken. Make sure that both sides of any steps have handrails.  Any raised decks and porches should have guardrails on the edges.  Have any leaves, snow, or ice cleared regularly.  Use sand or salt on walking paths during winter.  Clean up any spills in your garage right away. This includes oil or grease spills. What can I do in the bathroom?  Use night lights.  Install grab bars by the toilet and in the tub and shower. Do not use towel bars as grab bars.  Use non-skid mats or decals in the tub or shower.  If you need to sit down in the shower, use a plastic, non-slip stool.  Keep the floor dry. Clean up any water that spills on the floor as soon as it happens.  Remove soap buildup in the tub or shower regularly.  Attach bath mats securely with double-sided non-slip rug tape.  Do not have throw rugs and other things on the floor that can make you trip. What can I do in the bedroom?  Use night lights.  Make sure that you have a light by your bed that is easy to  reach.  Do not use any sheets or blankets that are too big for your bed. They should not hang down onto the floor.  Have a firm chair that has side arms. You can use this for support while you get dressed.  Do not have throw rugs and other things on the floor that can make you trip. What can I do in the kitchen?  Clean up any spills right away.  Avoid walking on wet floors.  Keep items that you use a lot in easy-to-reach places.  If you need to reach something above you, use a strong step stool that has a grab bar.  Keep electrical cords out of the way.  Do not use floor polish or wax that makes floors slippery. If you must use wax, use non-skid floor wax.  Do not have throw rugs and other things on the floor that can make you trip. What can I do with my stairs?  Do not leave any items on the stairs.  Make sure that there are handrails on both sides of the stairs and use them. Fix handrails that are broken or  loose. Make sure that handrails are as long as the stairways.  Check any carpeting to make sure that it is firmly attached to the stairs. Fix any carpet that is loose or worn.  Avoid having throw rugs at the top or bottom of the stairs. If you do have throw rugs, attach them to the floor with carpet tape.  Make sure that you have a light switch at the top of the stairs and the bottom of the stairs. If you do not have them, ask someone to add them for you. What else can I do to help prevent falls?  Wear shoes that:  Do not have high heels.  Have rubber bottoms.  Are comfortable and fit you well.  Are closed at the toe. Do not wear sandals.  If you use a stepladder:  Make sure that it is fully opened. Do not climb a closed stepladder.  Make sure that both sides of the stepladder are locked into place.  Ask someone to hold it for you, if possible.  Clearly mark and make sure that you can see:  Any grab bars or handrails.  First and last steps.  Where the  edge of each step is.  Use tools that help you move around (mobility aids) if they are needed. These include:  Canes.  Walkers.  Scooters.  Crutches.  Turn on the lights when you go into a dark area. Replace any light bulbs as soon as they burn out.  Set up your furniture so you have a clear path. Avoid moving your furniture around.  If any of your floors are uneven, fix them.  If there are any pets around you, be aware of where they are.  Review your medicines with your doctor. Some medicines can make you feel dizzy. This can increase your chance of falling. Ask your doctor what other things that you can do to help prevent falls. This information is not intended to replace advice given to you by your health care provider. Make sure you discuss any questions you have with your health care provider. Document Released: 07/03/2009 Document Revised: 02/12/2016 Document Reviewed: 10/11/2014 Elsevier Interactive Patient Education  2017 [employer].

## 2019-01-09 NOTE — Progress Notes (Signed)
Subjective:   Amanda Simon is a 68 y.o. female who presents for Medicare Annual (Subsequent) preventive examination. Location of Patient: Home Location of Provider: Telehealth Consent was obtain for visit to be over via telehealth.  I verified that I am speaking with the correct person using two identifiers.  Review of Systems:   Cardiac Risk Factors include: advanced age (>86men, >21 women)     Objective:     Vitals: There were no vitals taken for this visit.  There is no height or weight on file to calculate BMI.  Advanced Directives 06/29/2017 02/20/2015  Does Patient Have a Medical Advance Directive? No No  Would patient like information on creating a medical advance directive? Yes (MAU/Ambulatory/Procedural Areas - Information given) No - patient declined information    Tobacco Social History   Tobacco Use  Smoking Status Current Some Day Smoker  . Packs/day: 0.25  . Years: 35.00  . Pack years: 8.75  . Types: Cigarettes  . Last attempt to quit: 03/01/2017  . Years since quitting: 1.8  Smokeless Tobacco Never Used  Tobacco Comment   2 per day      Ready to quit: Not Answered Counseling given: Not Answered Comment: 2 per day    Clinical Intake:  Pre-visit preparation completed: Yes  Pain : No/denies pain     Nutritional Risks: None Diabetes: No  How often do you need to have someone help you when you read instructions, pamphlets, or other written materials from your doctor or pharmacy?: 1 - Never What is the last grade level you completed in school?: 9  Interpreter Needed?: No     Past Medical History:  Diagnosis Date  . Depression   . Hyperlipidemia   . Hypertension   . IGT (impaired glucose tolerance) 2014  . Obesity   . Osteopenia    Past Surgical History:  Procedure Laterality Date  . ABDOMINAL HYSTERECTOMY  1975   fibroids  . COLONOSCOPY N/A 02/20/2015   Procedure: COLONOSCOPY;  Surgeon: Rogene Houston, MD;  Location: [facility];   Service: Endoscopy;  Laterality: N/A;  930  . PARTIAL HYSTERECTOMY    . TUBAL LIGATION     Family History  Problem Relation Age of Onset  . Lung cancer Mother   . Aneurysm Father        brain   . Hypertension Sister   . Hypertension Sister    Social History   Socioeconomic History  . Marital status: Married    Spouse name: Not on file  . Number of children: Not on file  . Years of education: Not on file  . Highest education level: Not on file  Occupational History  . Occupation: unemployed   Social Needs  . Financial resource strain: Not on file  . Food insecurity:    Worry: Not on file    Inability: Not on file  . Transportation needs:    Medical: Not on file    Non-medical: Not on file  Tobacco Use  . Smoking status: Current Some Day Smoker    Packs/day: 0.25    Years: 35.00    Pack years: 8.75    Types: Cigarettes    Last attempt to quit: 03/01/2017    Years since quitting: 1.8  . Smokeless tobacco: Never Used  . Tobacco comment: 2 per day   Substance and Sexual Activity  . Alcohol use: No    Alcohol/week: 0.0 standard drinks  . Drug use: No  . Sexual  activity: Yes    Birth control/protection: Surgical  Lifestyle  . Physical activity:    Days per week: 7 days    Minutes per session: 20 min  . Stress: Only a little  Relationships  . Social connections:    Talks on phone: Twice a week    Gets together: Once a week    Attends religious service: Never    Active member of club or organization: No    Attends meetings of clubs or organizations: Never    Relationship status: Married  Other Topics Concern  . Not on file  Social History Narrative  . Not on file    Outpatient Encounter Medications as of 01/09/2019  Medication Sig  . aspirin 81 MG chewable tablet Chew 81 mg by mouth daily.  . cholecalciferol (VITAMIN D) 400 units TABS tablet Take 400 Units by mouth daily.  Marland Kitchen losartan-hydrochlorothiazide (HYZAAR) 100-25 MG tablet TAKE 1 TABLET BY MOUTH EVERY  DAY  . simvastatin (ZOCOR) 40 MG tablet TAKE 1 TABLET BY MOUTH EVERYDAY AT BEDTIME  . [DISCONTINUED] buPROPion (WELLBUTRIN XL) 150 MG 24 hr tablet Take 1 tablet (150 mg total) by mouth daily. (Patient not taking: Reported on 01/09/2019)   No facility-administered encounter medications on file as of 01/09/2019.     Activities of Daily Living In your present state of health, do you have any difficulty performing the following activities: 01/09/2019  Hearing? N  Vision? N  Difficulty concentrating or making decisions? N  Walking or climbing stairs? N  Dressing or bathing? N  Doing errands, shopping? N  Preparing Food and eating ? N  Using the Toilet? N  In the past six months, have you accidently leaked urine? N  Do you have problems with loss of bowel control? N  Managing your Medications? N  Managing your Finances? N  Housekeeping or managing your Housekeeping? N  Some recent data might be hidden    Patient Care Team: Fayrene Helper, MD as PCP - General    Assessment:   This is a routine wellness examination for [place].  Exercise Activities and Dietary recommendations Current Exercise Habits: Home exercise routine, Type of exercise: Other - see comments;walking, Time (Minutes): 20, Frequency (Times/Week): 7, Weekly Exercise (Minutes/Week): 140, Intensity: Mild, Exercise limited by: None identified  Goals    . Exercise 3x per week (30 min per time)     Recommend increasing your exercise program to at least 3 days a week for 30-45 minutes at a time as tolerated.         Fall Risk Fall Risk  01/09/2019 08/24/2018 10/25/2017 10/25/2017 06/29/2017  Falls in the past year? 0 0 No No No  Injury with Fall? 0 - - - -   Is the patient's home free of loose throw rugs in walkways, pet beds, electrical cords, etc?   yes      Grab bars in the bathroom? yes      Handrails on the stairs?   yes      Adequate lighting?   yes  Timed Get Up and Go performed:  telephone visit, unable to  assess  Depression Screen PHQ 2/9 Scores 01/09/2019 08/24/2018 08/24/2018 10/25/2017  PHQ - 2 Score 0 4 1 0  PHQ- 9 Score - 14 2 -     Cognitive Function     6CIT Screen 01/09/2019 06/29/2017  What Year? 0 points 0 points  What month? 0 points 0 points  What time? 0 points 0 points  Count back from 20 0 points 0 points  Months in reverse 0 points 0 points  Repeat phrase 0 points 0 points  Total Score 0 0    Immunization History  Administered Date(s) Administered  . Influenza Whole 08/02/2011  . Influenza, High Dose Seasonal PF 08/24/2018  . Influenza,inj,Quad PF,6+ Mos 06/11/2013, 09/24/2014, 07/22/2015, 08/19/2016, 06/29/2017  . Pneumococcal Conjugate-13 02/05/2015  . Pneumococcal Polysaccharide-23 10/21/2016  . Tdap 12/31/2010  . Zoster 12/31/2010    Qualifies for Shingles Vaccine? Received zoster  Screening Tests Health Maintenance  Topic Date Due  . MAMMOGRAM  03/26/2019  . INFLUENZA VACCINE  04/21/2019  . COLONOSCOPY  02/20/2020  . TETANUS/TDAP  12/30/2020  . DEXA SCAN  Completed  . Hepatitis C Screening  Completed  . PNA vac Low Risk Adult  Completed    Cancer Screenings: Lung: Low Dose CT Chest recommended if Age 89-80 years, 30 pack-year currently smoking OR have quit w/in 15years. Patient does qualify. Discuss with Dr Moshe Cipro Breast:  Up to date on Mammogram? Yes   Up to date of Bone Density/Dexa? Yes Colorectal: Due in 2021  Additional Screenings: Hepatitis C Screening: Completed     Plan:    1. Encounter for Medicare annual wellness exam I have personally reviewed and noted the following in the patient's chart:   . Medical and social history . Use of alcohol, tobacco or illicit drugs  . Current medications and supplements . Functional ability and status . Nutritional status . Physical activity . Advanced directives . List of other physicians . Hospitalizations, surgeries, and ER visits in previous 12 months . Vitals . Screenings to include  cognitive, depression, and falls . Referrals and appointments  In addition, I have reviewed and discussed with patient certain preventive protocols, quality metrics, and best practice recommendations. A written personalized care plan for preventive services as well as general preventive health recommendations were provided to patient.   I provided 20 minutes of non-face-to-face time during this encounter.    Perlie Mayo, NP  01/09/2019

## 2019-01-10 DIAGNOSIS — I10 Essential (primary) hypertension: Secondary | ICD-10-CM | POA: Diagnosis not present

## 2019-01-10 DIAGNOSIS — R7302 Impaired glucose tolerance (oral): Secondary | ICD-10-CM | POA: Diagnosis not present

## 2019-01-10 DIAGNOSIS — E559 Vitamin D deficiency, unspecified: Secondary | ICD-10-CM | POA: Diagnosis not present

## 2019-01-10 DIAGNOSIS — Z0000 Encounter for general adult medical examination without abnormal findings: Secondary | ICD-10-CM | POA: Diagnosis not present

## 2019-01-10 NOTE — Telephone Encounter (Signed)
noted 

## 2019-01-10 NOTE — Telephone Encounter (Signed)
Haven't been able to get pt on the phone to clarify metformin refill request. Is asking for refill but its not on her medlist, Ok to refill?

## 2019-01-10 NOTE — Telephone Encounter (Signed)
Please do not refill unless she calls for it

## 2019-01-11 LAB — TSH: TSH: 1.13 mIU/L (ref 0.40–4.50)

## 2019-01-11 LAB — HEMOGLOBIN A1C
Hgb A1c MFr Bld: 6.2 % of total Hgb — ABNORMAL HIGH (ref <5.7)
Mean Plasma Glucose: 131 (calc)
eAG (mmol/L): 7.3 (calc)

## 2019-01-11 LAB — BASIC METABOLIC PANEL WITH GFR
BUN: 14 mg/dL (ref 7–25)
CALCIUM: 9.7 mg/dL (ref 8.6–10.4)
CHLORIDE: 103 mmol/L (ref 98–110)
CO2: 32 mmol/L (ref 20–32)
Creat: 0.76 mg/dL (ref 0.50–0.99)
GFR, Est African American: 93 mL/min/{1.73_m2} (ref >=60)
GFR, Est Non African American: 80 mL/min/{1.73_m2} (ref >=60)
Glucose, Bld: 113 mg/dL (ref 65–139)
Potassium: 4 mmol/L (ref 3.5–5.3)
Sodium: 142 mmol/L (ref 135–146)

## 2019-01-11 LAB — CBC
HEMATOCRIT: 38.3 % (ref 35.0–45.0)
HEMOGLOBIN: 12.8 g/dL (ref 11.7–15.5)
MCH: 29.6 pg (ref 27.0–33.0)
MCHC: 33.4 g/dL (ref 32.0–36.0)
MCV: 88.5 fL (ref 80.0–100.0)
MPV: 11.6 fL (ref 7.5–12.5)
Platelets: 307 10*3/uL (ref 140–400)
RBC: 4.33 10*6/uL (ref 3.80–5.10)
RDW: 12.1 % (ref 11.0–15.0)
WBC: 8.2 10*3/uL (ref 3.8–10.8)

## 2019-01-11 LAB — VITAMIN D 25 HYDROXY (VIT D DEFICIENCY, FRACTURES): Vit D, 25-Hydroxy: 27 ng/mL — ABNORMAL LOW (ref 30–100)

## 2019-01-17 ENCOUNTER — Other Ambulatory Visit: Payer: Self-pay

## 2019-01-17 MED ORDER — SIMVASTATIN 40 MG PO TABS: ORAL_TABLET | ORAL | 1 refills | Status: DC

## 2019-01-24 ENCOUNTER — Encounter: Payer: Medicare HMO | Admitting: Family Medicine

## 2019-02-07 ENCOUNTER — Encounter: Payer: Medicare HMO | Admitting: Family Medicine

## 2019-02-15 ENCOUNTER — Other Ambulatory Visit: Payer: Self-pay | Admitting: Family Medicine

## 2019-06-26 ENCOUNTER — Other Ambulatory Visit: Payer: Self-pay | Admitting: Family Medicine

## 2019-07-09 ENCOUNTER — Ambulatory Visit (INDEPENDENT_AMBULATORY_CARE_PROVIDER_SITE_OTHER): Payer: Medicare HMO | Admitting: Family Medicine

## 2019-07-09 ENCOUNTER — Encounter: Payer: Self-pay | Admitting: Family Medicine

## 2019-07-09 ENCOUNTER — Other Ambulatory Visit: Payer: Self-pay

## 2019-07-09 VITALS — BP 122/80 | HR 82 | Temp 97.2°F | Ht 66.0 in | Wt 206.0 lb

## 2019-07-09 DIAGNOSIS — Z1231 Encounter for screening mammogram for malignant neoplasm of breast: Secondary | ICD-10-CM | POA: Diagnosis not present

## 2019-07-09 DIAGNOSIS — M8580 Other specified disorders of bone density and structure, unspecified site: Secondary | ICD-10-CM

## 2019-07-09 DIAGNOSIS — I10 Essential (primary) hypertension: Secondary | ICD-10-CM | POA: Diagnosis not present

## 2019-07-09 DIAGNOSIS — E782 Mixed hyperlipidemia: Secondary | ICD-10-CM

## 2019-07-09 DIAGNOSIS — Z0000 Encounter for general adult medical examination without abnormal findings: Secondary | ICD-10-CM

## 2019-07-09 DIAGNOSIS — Z780 Asymptomatic menopausal state: Secondary | ICD-10-CM

## 2019-07-09 LAB — COMPLETE METABOLIC PANEL WITH GFR
AG Ratio: 1.3 (calc) (ref 1.0–2.5)
ALT: 11 U/L (ref 6–29)
AST: 13 U/L (ref 10–35)
Albumin: 4 g/dL (ref 3.6–5.1)
Alkaline phosphatase (APISO): 98 U/L (ref 37–153)
BUN: 13 mg/dL (ref 7–25)
CO2: 30 mmol/L (ref 20–32)
Calcium: 9.5 mg/dL (ref 8.6–10.4)
Chloride: 104 mmol/L (ref 98–110)
Creat: 0.75 mg/dL (ref 0.60–0.93)
GFR, Est African American: 94 mL/min/{1.73_m2} (ref >=60)
GFR, Est Non African American: 81 mL/min/{1.73_m2} (ref >=60)
Globulin: 3.2 g/dL (calc) (ref 1.9–3.7)
Glucose, Bld: 96 mg/dL (ref 65–99)
Potassium: 4 mmol/L (ref 3.5–5.3)
Sodium: 141 mmol/L (ref 135–146)
Total Bilirubin: 0.5 mg/dL (ref 0.2–1.2)
Total Protein: 7.2 g/dL (ref 6.1–8.1)

## 2019-07-09 LAB — LIPID PANEL
Cholesterol: 156 mg/dL (ref <200)
HDL: 43 mg/dL — ABNORMAL LOW (ref >=50)
LDL Cholesterol (Calc): 97 mg/dL (calc)
Non-HDL Cholesterol (Calc): 113 mg/dL (calc) (ref <130)
Total CHOL/HDL Ratio: 3.6 (calc) (ref <5.0)
Triglycerides: 75 mg/dL (ref <150)

## 2019-07-09 NOTE — Assessment & Plan Note (Signed)

## 2019-07-09 NOTE — Patient Instructions (Addendum)
Wellness as before  F/U with MD in May 2021, call if you need me before  Please schedule mammogram and bone density at checkout , both due, pls schedule on same day if possible  Lipid, cmp and EGFR today  Please plan to stop smoking entirely, best for your health  It is important that you exercise regularly at least 30 minutes 5 times a week. If you develop chest pain, have severe difficulty breathing, or feel very tired, stop exercising immediately and seek medical attention  Think about what you will eat, plan ahead. Choose " clean, green, fresh or frozen" over canned, processed or packaged foods which are more sugary, salty and fatty. 70 to 75% of food eaten should be vegetables and fruit. Three meals at set times with snacks allowed between meals, but they must be fruit or vegetables. Aim to eat over a 12 hour period , example 7 am to 7 pm, and STOP after  your last meal of the day. Drink water,generally about 64 ounces per day, no other drink is as healthy. Fruit juice is best enjoyed in a healthy way, by EATING the fruit.  Thanks for choosing [facility], we consider it a privelige to serve you.

## 2019-07-09 NOTE — Progress Notes (Signed)
    Amanda Simon     MRN: [id number]      DOB: 1951/09/20  HPI: Patient is in for annual physical exam. No other health concerns are expressed or addressed at the visit. Recent labs, if available are reviewed. Immunization is reviewed , and  updated if needed.   PE: BP 122/80   Pulse 82   Temp (!) 97.2 F (36.2 C) (Temporal)   Ht 5\' 6"  (1.676 m)   Wt 206 lb (93.4 kg)   SpO2 96%   BMI 33.25 kg/m   Pleasant  female, alert and oriented x 3, in no cardio-pulmonary distress. Afebrile. HEENT No facial trauma or asymetry. Sinuses non tender.  Extra occullar muscles intact.. External ears normal, . Neck: supple, no adenopathy,JVD or thyromegaly.No bruits.  Chest: Clear to ascultation bilaterally.No crackles or wheezes. Non tender to palpation  Breast: No asymetry,no masses or lumps. No tenderness. No nipple discharge or inversion. No axillary or supraclavicular adenopathy  Cardiovascular system; Heart sounds normal,  S1 and  S2 ,no S3.  No murmur, or thrill. Apical beat not displaced Peripheral pulses normal.  Abdomen: Soft, non tender, no organomegaly or masses. No bruits. Bowel sounds normal. No guarding, tenderness or rebound.   Musculoskeletal exam: Decreased though adequate  ROM of spine, hips , shoulders and knees. No deformity ,swelling or crepitus noted. No muscle wasting or atrophy.   Neurologic: Cranial nerves 2 to 12 intact. Power, tone ,sensation and reflexes normal throughout. No disturbance in gait. No tremor.  Skin: Intact, no ulceration, erythema , scaling or rash noted. Pigmentation normal throughout  Psych; Normal mood and affect. Judgement and concentration normal   Assessment & Plan:  Annual physical exam Annual exam as documented. Counseling done  re healthy lifestyle involving commitment to 150 minutes exercise per week, heart healthy diet, and attaining healthy weight.The importance of adequate sleep also discussed. Regular seat  belt use and home safety, is also discussed. Changes in health habits are decided on by the patient with goals and time frames  set for achieving them. Immunization and cancer screening needs are specifically addressed at this visit.

## 2019-07-10 ENCOUNTER — Encounter: Payer: Self-pay | Admitting: *Deleted

## 2019-07-12 ENCOUNTER — Encounter: Payer: Self-pay | Admitting: Family Medicine

## 2019-07-19 ENCOUNTER — Ambulatory Visit (HOSPITAL_COMMUNITY): Payer: Medicare HMO

## 2019-07-19 ENCOUNTER — Other Ambulatory Visit (HOSPITAL_COMMUNITY): Payer: Medicare HMO

## 2019-07-20 DIAGNOSIS — H524 Presbyopia: Secondary | ICD-10-CM | POA: Diagnosis not present

## 2019-08-03 ENCOUNTER — Other Ambulatory Visit: Payer: Self-pay

## 2019-08-03 ENCOUNTER — Ambulatory Visit (HOSPITAL_COMMUNITY)
Admission: RE | Admit: 2019-08-03 | Discharge: 2019-08-03 | Disposition: A | Discharge status: Home / Self Care | Payer: Medicare HMO | Source: Ambulatory Visit | Attending: Family Medicine | Admitting: Family Medicine

## 2019-08-03 DIAGNOSIS — Z1231 Encounter for screening mammogram for malignant neoplasm of breast: Secondary | ICD-10-CM

## 2019-08-03 DIAGNOSIS — Z780 Asymptomatic menopausal state: Secondary | ICD-10-CM | POA: Diagnosis not present

## 2019-08-03 DIAGNOSIS — M85851 Other specified disorders of bone density and structure, right thigh: Secondary | ICD-10-CM | POA: Diagnosis not present

## 2019-08-03 DIAGNOSIS — M8580 Other specified disorders of bone density and structure, unspecified site: Secondary | ICD-10-CM | POA: Diagnosis not present

## 2019-08-06 ENCOUNTER — Encounter: Payer: Self-pay | Admitting: *Deleted

## 2019-09-13 ENCOUNTER — Other Ambulatory Visit: Payer: Self-pay | Admitting: Family Medicine

## 2020-01-10 ENCOUNTER — Encounter: Payer: Medicare HMO | Admitting: Family Medicine

## 2020-01-10 ENCOUNTER — Other Ambulatory Visit: Payer: Self-pay | Admitting: *Deleted

## 2020-01-10 MED ORDER — LOSARTAN POTASSIUM-HCTZ 100-25 MG PO TABS: 1.0000 | ORAL_TABLET | Freq: Every day | ORAL | 3 refills | Status: DC

## 2020-01-10 MED ORDER — SIMVASTATIN 40 MG PO TABS: ORAL_TABLET | ORAL | 1 refills | Status: DC

## 2020-01-11 ENCOUNTER — Telehealth: Payer: Self-pay

## 2020-01-11 NOTE — Telephone Encounter (Signed)
Pt is needing her Losartan & Simvastatin called in

## 2020-01-11 NOTE — Telephone Encounter (Signed)
These were sent to the pharmacy 01-10-20

## 2020-01-14 ENCOUNTER — Telehealth (INDEPENDENT_AMBULATORY_CARE_PROVIDER_SITE_OTHER): Payer: Medicare HMO

## 2020-01-14 ENCOUNTER — Other Ambulatory Visit: Payer: Self-pay

## 2020-01-14 VITALS — BP 122/80 | Ht 66.0 in | Wt 206.0 lb

## 2020-01-14 DIAGNOSIS — Z0000 Encounter for general adult medical examination without abnormal findings: Secondary | ICD-10-CM | POA: Diagnosis not present

## 2020-01-14 NOTE — Patient Instructions (Addendum)
Amanda Simon , Thank you for taking time to come for your Medicare Wellness Visit. I appreciate your ongoing commitment to your health goals. Please review the following plan we discussed and let me know if I can assist you in the future.   Screening recommendations/referrals: Colonoscopy: up to date  Mammogram: up to date  Bone Density: up to date  Recommended yearly ophthalmology/optometry visit for glaucoma screening and checkup Recommended yearly dental visit for hygiene and checkup  Vaccinations: Influenza vaccine: due in the fall  Pneumococcal vaccine: up to date  Tdap vaccine: up to date  Shingles vaccine: check with pharmacy   Advanced directives: mailed   Conditions/risks identified: nicotine use, fall risk   Next appointment: 1 year   Preventive Care 43 Years and Older, Female Preventive care refers to lifestyle choices and visits with your health care provider that can promote health and wellness. What does preventive care include?  A yearly physical exam. This is also called an annual well check.  Dental exams once or twice a year.  Routine eye exams. Ask your health care provider how often you should have your eyes checked.  Personal lifestyle choices, including:  Daily care of your teeth and gums.  Regular physical activity.  Eating a healthy diet.  Avoiding tobacco and drug use.  Limiting alcohol use.  Practicing safe sex.  Taking low-dose aspirin every day.  Taking vitamin and mineral supplements as recommended by your health care provider. What happens during an annual well check? The services and screenings done by your health care provider during your annual well check will depend on your age, overall health, lifestyle risk factors, and family history of disease. Counseling  Your health care provider may ask you questions about your:  Alcohol use.  Tobacco use.  Drug use.  Emotional well-being.  Home and relationship  well-being.  Sexual activity.  Eating habits.  History of falls.  Memory and ability to understand (cognition).  Work and work Statistician.  Reproductive health. Screening  You may have the following tests or measurements:  Height, weight, and BMI.  Blood pressure.  Lipid and cholesterol levels. These may be checked every 5 years, or more frequently if you are over 27 years old.  Skin check.  Lung cancer screening. You may have this screening every year starting at age 26 if you have a 30-pack-year history of smoking and currently smoke or have quit within the past 15 years.  Fecal occult blood test (FOBT) of the stool. You may have this test every year starting at age 49.  Flexible sigmoidoscopy or colonoscopy. You may have a sigmoidoscopy every 5 years or a colonoscopy every 10 years starting at age 65.  Hepatitis C blood test.  Hepatitis B blood test.  Sexually transmitted disease (STD) testing.  Diabetes screening. This is done by checking your blood sugar (glucose) after you have not eaten for a while (fasting). You may have this done every 1-3 years.  Bone density scan. This is done to screen for osteoporosis. You may have this done starting at age 64.  Mammogram. This may be done every 1-2 years. Talk to your health care provider about how often you should have regular mammograms. Talk with your health care provider about your test results, treatment options, and if necessary, the need for more tests. Vaccines  Your health care provider may recommend certain vaccines, such as:  Influenza vaccine. This is recommended every year.  Tetanus, diphtheria, and acellular pertussis (Tdap, Td) vaccine.  You may need a Td booster every 10 years.  Zoster vaccine. You may need this after age 93.  Pneumococcal 13-valent conjugate (PCV13) vaccine. One dose is recommended after age 70.  Pneumococcal polysaccharide (PPSV23) vaccine. One dose is recommended after age  32. Talk to your health care provider about which screenings and vaccines you need and how often you need them. This information is not intended to replace advice given to you by your health care provider. Make sure you discuss any questions you have with your health care provider. Document Released: 10/03/2015 Document Revised: 05/26/2016 Document Reviewed: 07/08/2015 Elsevier Interactive Patient Education  2017 [place] Prevention in the Home Falls can cause injuries. They can happen to people of all ages. There are many things you can do to make your home safe and to help prevent falls. What can I do on the outside of my home?  Regularly fix the edges of walkways and driveways and fix any cracks.  Remove anything that might make you trip as you walk through a door, such as a raised step or threshold.  Trim any bushes or trees on the path to your home.  Use bright outdoor lighting.  Clear any walking paths of anything that might make someone trip, such as rocks or tools.  Regularly check to see if handrails are loose or broken. Make sure that both sides of any steps have handrails.  Any raised decks and porches should have guardrails on the edges.  Have any leaves, snow, or ice cleared regularly.  Use sand or salt on walking paths during winter.  Clean up any spills in your garage right away. This includes oil or grease spills. What can I do in the bathroom?  Use night lights.  Install grab bars by the toilet and in the tub and shower. Do not use towel bars as grab bars.  Use non-skid mats or decals in the tub or shower.  If you need to sit down in the shower, use a plastic, non-slip stool.  Keep the floor dry. Clean up any water that spills on the floor as soon as it happens.  Remove soap buildup in the tub or shower regularly.  Attach bath mats securely with double-sided non-slip rug tape.  Do not have throw rugs and other things on the floor that can make  you trip. What can I do in the bedroom?  Use night lights.  Make sure that you have a light by your bed that is easy to reach.  Do not use any sheets or blankets that are too big for your bed. They should not hang down onto the floor.  Have a firm chair that has side arms. You can use this for support while you get dressed.  Do not have throw rugs and other things on the floor that can make you trip. What can I do in the kitchen?  Clean up any spills right away.  Avoid walking on wet floors.  Keep items that you use a lot in easy-to-reach places.  If you need to reach something above you, use a strong step stool that has a grab bar.  Keep electrical cords out of the way.  Do not use floor polish or wax that makes floors slippery. If you must use wax, use non-skid floor wax.  Do not have throw rugs and other things on the floor that can make you trip. What can I do with my stairs?  Do not leave any  items on the stairs.  Make sure that there are handrails on both sides of the stairs and use them. Fix handrails that are broken or loose. Make sure that handrails are as long as the stairways.  Check any carpeting to make sure that it is firmly attached to the stairs. Fix any carpet that is loose or worn.  Avoid having throw rugs at the top or bottom of the stairs. If you do have throw rugs, attach them to the floor with carpet tape.  Make sure that you have a light switch at the top of the stairs and the bottom of the stairs. If you do not have them, ask someone to add them for you. What else can I do to help prevent falls?  Wear shoes that:  Do not have high heels.  Have rubber bottoms.  Are comfortable and fit you well.  Are closed at the toe. Do not wear sandals.  If you use a stepladder:  Make sure that it is fully opened. Do not climb a closed stepladder.  Make sure that both sides of the stepladder are locked into place.  Ask someone to hold it for you, if  possible.  Clearly mark and make sure that you can see:  Any grab bars or handrails.  First and last steps.  Where the edge of each step is.  Use tools that help you move around (mobility aids) if they are needed. These include:  Canes.  Walkers.  Scooters.  Crutches.  Turn on the lights when you go into a dark area. Replace any light bulbs as soon as they burn out.  Set up your furniture so you have a clear path. Avoid moving your furniture around.  If any of your floors are uneven, fix them.  If there are any pets around you, be aware of where they are.  Review your medicines with your doctor. Some medicines can make you feel dizzy. This can increase your chance of falling. Ask your doctor what other things that you can do to help prevent falls. This information is not intended to replace advice given to you by your health care provider. Make sure you discuss any questions you have with your health care provider. Document Released: 07/03/2009 Document Revised: 02/12/2016 Document Reviewed: 10/11/2014 Elsevier Interactive Patient Education  2017 [employer].

## 2020-01-14 NOTE — Progress Notes (Signed)
Subjective:   Amanda Simon is a 69 y.o. female who presents for Medicare Annual (Subsequent) preventive examination.  Review of Systems:   Cardiac Risk Factors include: advanced age (>32men, >31 women);dyslipidemia;hypertension;smoking/ tobacco exposure     Objective:     Vitals: BP 122/80   Ht 5\' 6"  (1.676 m)   Wt 206 lb (93.4 kg)   BMI 33.25 kg/m   Body mass index is 33.25 kg/m.  Advanced Directives 01/14/2020 06/29/2017 02/20/2015  Does Patient Have a Medical Advance Directive? No No No  Would patient like information on creating a medical advance directive? Yes (ED - Information included in AVS) Yes (MAU/Ambulatory/Procedural Areas - Information given) No - patient declined information    Tobacco Social History   Tobacco Use  Smoking Status Current Some Day Smoker  . Packs/day: 0.25  . Years: 35.00  . Pack years: 8.75  . Types: Cigarettes  . Last attempt to quit: 03/01/2017  . Years since quitting: 2.8  Smokeless Tobacco Never Used  Tobacco Comment   2 per day      Ready to quit: Not Answered Counseling given: Not Answered Comment: 2 per day    Clinical Intake:  Pre-visit preparation completed: Yes  Pain : No/denies pain     Nutritional Status: BMI > 30  Obese Diabetes: No  How often do you need to have someone help you when you read instructions, pamphlets, or other written materials from your doctor or pharmacy?: 1 - Never  Interpreter Needed?: No  Information entered by :: [name]  Past Medical History:  Diagnosis Date  . Depression   . Depression, major, single episode, moderate ([place]) 08/27/2018   PHQ 9 score of 14 in 08/2018, not suicidal or homicidal, medication started and referred to telethewrapy  . Hyperlipidemia   . Hypertension   . IGT (impaired glucose tolerance) 2014  . Obesity   . Osteopenia    Past Surgical History:  Procedure Laterality Date  . ABDOMINAL HYSTERECTOMY  1975   fibroids  . COLONOSCOPY N/A 02/20/2015     Procedure: COLONOSCOPY;  Surgeon: Rogene Houston, MD;  Location: [facility];  Service: Endoscopy;  Laterality: N/A;  930  . PARTIAL HYSTERECTOMY    . TUBAL LIGATION     Family History  Problem Relation Age of Onset  . Lung cancer Mother   . Aneurysm Father        brain   . Hypertension Sister   . Hypertension Sister    Social History   Socioeconomic History  . Marital status: Married    Spouse name: Not on file  . Number of children: Not on file  . Years of education: Not on file  . Highest education level: Not on file  Occupational History  . Occupation: unemployed   Tobacco Use  . Smoking status: Current Some Day Smoker    Packs/day: 0.25    Years: 35.00    Pack years: 8.75    Types: Cigarettes    Last attempt to quit: 03/01/2017    Years since quitting: 2.8  . Smokeless tobacco: Never Used  . Tobacco comment: 2 per day   Substance and Sexual Activity  . Alcohol use: No    Alcohol/week: 0.0 standard drinks  . Drug use: No  . Sexual activity: Yes    Birth control/protection: Surgical  Other Topics Concern  . Not on file  Social History Narrative  . Not on file   Social Determinants of Health  Financial Resource Strain: Low Risk   . Difficulty of Paying Living Expenses: Not hard at all  Food Insecurity: No Food Insecurity  . Worried About Charity fundraiser in the Last Year: Never true  . Ran Out of Food in the Last Year: Never true  Transportation Needs: No Transportation Needs  . Lack of Transportation (Medical): No  . Lack of Transportation (Non-Medical): No  Physical Activity: Sufficiently Active  . Days of Exercise per Week: 7 days  . Minutes of Exercise per Session: 40 min  Stress: No Stress Concern Present  . Feeling of Stress : Not at all  Social Connections: Slightly Isolated  . Frequency of Communication with Friends and Family: More than three times a week  . Frequency of Social Gatherings with Friends and Family: Twice a week  . Attends  Religious Services: More than 4 times per year  . Active Member of Clubs or Organizations: No  . Attends Archivist Meetings: Never  . Marital Status: Married    Outpatient Encounter Medications as of 01/14/2020  Medication Sig  . aspirin 81 MG chewable tablet Chew 81 mg by mouth daily.  . cholecalciferol (VITAMIN D) 400 units TABS tablet Take 400 Units by mouth daily.  Marland Kitchen losartan-hydrochlorothiazide (HYZAAR) 100-25 MG tablet Take 1 tablet by mouth daily.  . simvastatin (ZOCOR) 40 MG tablet TAKE 1 TABLET BY MOUTH AT BEDTIME   No facility-administered encounter medications on file as of 01/14/2020.    Activities of Daily Living In your present state of health, do you have any difficulty performing the following activities: 01/14/2020 01/14/2020  Hearing? N N  Vision? N N  Difficulty concentrating or making decisions? N N  Walking or climbing stairs? N N  Dressing or bathing? N N  Doing errands, shopping? N N  Preparing Food and eating ? N -  Using the Toilet? N -  In the past six months, have you accidently leaked urine? Y -  Do you have problems with loss of bowel control? N -  Managing your Medications? N -  Managing your Finances? N -  Housekeeping or managing your Housekeeping? N -  Some recent data might be hidden    Patient Care Team: Fayrene Helper, MD as PCP - General    Assessment:   This is a routine wellness examination for [name].  Exercise Activities and Dietary recommendations Current Exercise Habits: Home exercise routine, Time (Minutes): 40, Frequency (Times/Week): 7, Weekly Exercise (Minutes/Week): 280, Intensity: Mild, Exercise limited by: None identified  Goals    . Exercise 3x per week (30 min per time)     Recommend increasing your exercise program to at least 3 days a week for 30-45 minutes at a time as tolerated.      Marland Kitchen LIFESTYLE - DECREASE FALLS RISK    . Quit Smoking     Patient down to 2-3 per day        Fall Risk Fall Risk   01/14/2020 07/09/2019 01/09/2019 08/24/2018 10/25/2017  Falls in the past year? 0 0 0 0 No  Number falls in past yr: 0 0 - - -  Injury with Fall? 0 0 0 - -   Is the patient's home free of loose throw rugs in walkways, pet beds, electrical cords, etc?   yes      Grab bars in the bathroom? yes      Handrails on the stairs?   yes      Adequate lighting?  yes  Timed Get Up and Go performed: get up and go not done sue to virtual visit   Depression Screen PHQ 2/9 Scores 01/14/2020 01/14/2020 07/09/2019 01/09/2019  PHQ - 2 Score 0 0 0 0  PHQ- 9 Score 0 - - -     Cognitive Function     6CIT Screen 01/14/2020 01/09/2019 06/29/2017  What Year? 0 points 0 points 0 points  What month? 0 points 0 points 0 points  What time? 0 points 0 points 0 points  Count back from 20 0 points 0 points 0 points  Months in reverse 0 points 0 points 0 points  Repeat phrase 0 points 0 points 0 points  Total Score 0 0 0    Immunization History  Administered Date(s) Administered  . Influenza Whole 08/02/2011  . Influenza, High Dose Seasonal PF 08/24/2018  . Influenza,inj,Quad PF,6+ Mos 06/11/2013, 09/24/2014, 07/22/2015, 08/19/2016, 06/29/2017  . Pneumococcal Conjugate-13 02/05/2015  . Pneumococcal Polysaccharide-23 10/21/2016  . Tdap 12/31/2010  . Zoster 12/31/2010    Qualifies for Shingles Vaccine? Will check with pharmacy   Screening Tests Health Maintenance  Topic Date Due  . COVID-19 Vaccine (1) Never done  . COLONOSCOPY  02/20/2020  . INFLUENZA VACCINE  04/20/2020  . TETANUS/TDAP  12/30/2020  . MAMMOGRAM  08/02/2021  . DEXA SCAN  Completed  . Hepatitis C Screening  Completed  . PNA vac Low Risk Adult  Completed    Cancer Screenings: Lung: Low Dose CT Chest recommended if Age 36-80 years, 30 pack-year currently smoking OR have quit w/in 15years. Patient does not qualify. Breast:  Up to date on Mammogram? Yes   Up to date of Bone Density/Dexa? Yes Colorectal: up to date   Additional  Screenings:  Hepatitis C Screening: completed      Plan:     I have personally reviewed and noted the following in the patient's chart:   . Medical and social history . Use of alcohol, tobacco or illicit drugs  . Current medications and supplements . Functional ability and status . Nutritional status . Physical activity . Advanced directives . List of other physicians . Hospitalizations, surgeries, and ER visits in previous 12 months . Vitals . Screenings to include cognitive, depression, and falls . Referrals and appointments  In addition, I have reviewed and discussed with patient certain preventive protocols, quality metrics, and best practice recommendations. A written personalized care plan for preventive services as well as general preventive health recommendations were provided to patient.     Kate Sable, LPN, LPN  [phone]

## 2020-02-04 ENCOUNTER — Ambulatory Visit: Payer: Medicare HMO | Admitting: Family Medicine

## 2020-02-05 ENCOUNTER — Encounter (INDEPENDENT_AMBULATORY_CARE_PROVIDER_SITE_OTHER): Payer: Self-pay | Admitting: *Deleted

## 2020-06-03 ENCOUNTER — Ambulatory Visit: Payer: Medicare HMO | Admitting: Family Medicine

## 2020-06-10 ENCOUNTER — Telehealth: Payer: Self-pay | Admitting: *Deleted

## 2020-06-10 ENCOUNTER — Other Ambulatory Visit: Payer: Self-pay | Admitting: *Deleted

## 2020-06-10 DIAGNOSIS — E782 Mixed hyperlipidemia: Secondary | ICD-10-CM

## 2020-06-10 DIAGNOSIS — Z131 Encounter for screening for diabetes mellitus: Secondary | ICD-10-CM

## 2020-06-10 DIAGNOSIS — I10 Essential (primary) hypertension: Secondary | ICD-10-CM

## 2020-06-10 DIAGNOSIS — E559 Vitamin D deficiency, unspecified: Secondary | ICD-10-CM

## 2020-06-10 NOTE — Telephone Encounter (Signed)
Pt labs ordered and pt notified

## 2020-06-10 NOTE — Telephone Encounter (Signed)
Please order a maternal fasting CBC lipid CMP and EGFR TSH HbA1c and vitamin D.  Please also let her know that I look forward to her October visit.

## 2020-06-10 NOTE — Telephone Encounter (Signed)
Pt is calling wanting to get her labs drawn before her visit coming up in October. What would you like to order for pt?

## 2020-06-12 DIAGNOSIS — Z131 Encounter for screening for diabetes mellitus: Secondary | ICD-10-CM | POA: Diagnosis not present

## 2020-06-12 DIAGNOSIS — E559 Vitamin D deficiency, unspecified: Secondary | ICD-10-CM | POA: Diagnosis not present

## 2020-06-12 DIAGNOSIS — I10 Essential (primary) hypertension: Secondary | ICD-10-CM | POA: Diagnosis not present

## 2020-06-12 DIAGNOSIS — E782 Mixed hyperlipidemia: Secondary | ICD-10-CM | POA: Diagnosis not present

## 2020-06-13 LAB — CBC
Hematocrit: 40.9 % (ref 34.0–46.6)
Hemoglobin: 13.4 g/dL (ref 11.1–15.9)
MCH: 29.7 pg (ref 26.6–33.0)
MCHC: 32.8 g/dL (ref 31.5–35.7)
MCV: 91 fL (ref 79–97)
Platelets: 292 10*3/uL (ref 150–450)
RBC: 4.51 x10E6/uL (ref 3.77–5.28)
RDW: 12 % (ref 11.7–15.4)
WBC: 7.1 10*3/uL (ref 3.4–10.8)

## 2020-06-13 LAB — CMP14+EGFR
ALT: 12 IU/L (ref 0–32)
AST: 16 IU/L (ref 0–40)
Albumin/Globulin Ratio: 1.2 (ref 1.2–2.2)
Albumin: 4.1 g/dL (ref 3.8–4.8)
Alkaline Phosphatase: 115 IU/L (ref 44–121)
BUN/Creatinine Ratio: 10 — ABNORMAL LOW (ref 12–28)
BUN: 8 mg/dL (ref 8–27)
Bilirubin Total: 0.6 mg/dL (ref 0.0–1.2)
CO2: 25 mmol/L (ref 20–29)
Calcium: 9.5 mg/dL (ref 8.7–10.3)
Chloride: 102 mmol/L (ref 96–106)
Creatinine, Ser: 0.82 mg/dL (ref 0.57–1.00)
GFR calc Af Amer: 84 mL/min/{1.73_m2} (ref >59)
GFR calc non Af Amer: 73 mL/min/{1.73_m2} (ref >59)
Globulin, Total: 3.5 g/dL (ref 1.5–4.5)
Glucose: 105 mg/dL — ABNORMAL HIGH (ref 65–99)
Potassium: 3.9 mmol/L (ref 3.5–5.2)
Sodium: 142 mmol/L (ref 134–144)
Total Protein: 7.6 g/dL (ref 6.0–8.5)

## 2020-06-13 LAB — TSH: TSH: 1.06 u[IU]/mL (ref 0.450–4.500)

## 2020-06-13 LAB — HEMOGLOBIN A1C
Est. average glucose Bld gHb Est-mCnc: 123 mg/dL
Hgb A1c MFr Bld: 5.9 % — ABNORMAL HIGH (ref 4.8–5.6)

## 2020-06-13 LAB — VITAMIN D 25 HYDROXY (VIT D DEFICIENCY, FRACTURES): Vit D, 25-Hydroxy: 17.9 ng/mL — ABNORMAL LOW (ref 30.0–100.0)

## 2020-06-13 LAB — LIPID PANEL
Chol/HDL Ratio: 3.7 ratio (ref 0.0–4.4)
Cholesterol, Total: 189 mg/dL (ref 100–199)
HDL: 51 mg/dL (ref >39)
LDL Chol Calc (NIH): 122 mg/dL — ABNORMAL HIGH (ref 0–99)
Triglycerides: 89 mg/dL (ref 0–149)
VLDL Cholesterol Cal: 16 mg/dL (ref 5–40)

## 2020-06-18 ENCOUNTER — Other Ambulatory Visit: Payer: Self-pay

## 2020-06-18 ENCOUNTER — Encounter: Payer: Self-pay | Admitting: Family Medicine

## 2020-06-18 ENCOUNTER — Ambulatory Visit (INDEPENDENT_AMBULATORY_CARE_PROVIDER_SITE_OTHER): Payer: Medicare HMO | Admitting: Family Medicine

## 2020-06-18 VITALS — BP 124/82 | HR 86 | Resp 16 | Ht 66.0 in | Wt 203.0 lb

## 2020-06-18 DIAGNOSIS — D126 Benign neoplasm of colon, unspecified: Secondary | ICD-10-CM

## 2020-06-18 DIAGNOSIS — R7302 Impaired glucose tolerance (oral): Secondary | ICD-10-CM

## 2020-06-18 DIAGNOSIS — F17219 Nicotine dependence, cigarettes, with unspecified nicotine-induced disorders: Secondary | ICD-10-CM | POA: Diagnosis not present

## 2020-06-18 DIAGNOSIS — Z1231 Encounter for screening mammogram for malignant neoplasm of breast: Secondary | ICD-10-CM

## 2020-06-18 DIAGNOSIS — I10 Essential (primary) hypertension: Secondary | ICD-10-CM | POA: Diagnosis not present

## 2020-06-18 DIAGNOSIS — Z23 Encounter for immunization: Secondary | ICD-10-CM

## 2020-06-18 DIAGNOSIS — Z1211 Encounter for screening for malignant neoplasm of colon: Secondary | ICD-10-CM

## 2020-06-18 DIAGNOSIS — E669 Obesity, unspecified: Secondary | ICD-10-CM | POA: Diagnosis not present

## 2020-06-18 DIAGNOSIS — E782 Mixed hyperlipidemia: Secondary | ICD-10-CM | POA: Diagnosis not present

## 2020-06-18 DIAGNOSIS — E559 Vitamin D deficiency, unspecified: Secondary | ICD-10-CM

## 2020-06-18 MED ORDER — SIMVASTATIN 40 MG PO TABS: 40.0000 mg | ORAL_TABLET | Freq: Every day | ORAL | 3 refills | Status: DC

## 2020-06-18 NOTE — Assessment & Plan Note (Signed)
Controlled, no change in medication DASH diet and commitment to daily physical activity for a minimum of 30 minutes discussed and encouraged, as a part of hypertension management. The importance of attaining a healthy weight is also discussed.  BP/Weight 06/18/2020 01/14/2020 07/09/2019 08/24/2018 02/21/2018 04/10/5749 01/18/8334  Systolic BP 825 189 842 103 128 118 867  Diastolic BP 82 80 80 78 80 85 80  Wt. (Lbs) 203 206 206 206 196.12 - 206  BMI 32.77 33.25 33.25 33.25 31.65 - 33.25

## 2020-06-18 NOTE — Progress Notes (Signed)
Amanda Simon     MRN: [id number]      DOB: 1951/06/22   HPI Amanda Simon is here for follow up and re-evaluation of chronic medical conditions, medication management and review of any available recent lab and radiology data.  Preventive health is updated, specifically  Cancer screening and Immunization.   Questions or concerns regarding consultations or procedures which the PT has had in the interim are  addressed. The PT denies any adverse reactions to current medications since the last visit.  There are no new concerns.  There are no specific complaints   ROS Denies recent fever or chills. Denies sinus pressure, nasal congestion, ear pain or sore throat. Denies chest congestion, productive cough or wheezing. Denies chest pains, palpitations and leg swelling Denies abdominal pain, nausea, vomiting,diarrhea or constipation.   Denies dysuria, frequency, hesitancy or incontinence. Denies joint pain, swelling and limitation in mobility. Denies headaches, seizures, numbness, or tingling. Denies depression, anxiety or insomnia. Denies skin break down or rash.   PE  BP 132/82    Pulse 86    Resp 16    Ht 5\' 6"  (1.676 m)    Wt 203 lb (92.1 kg)    SpO2 95%    BMI 32.77 kg/m   Patient alert and oriented and in no cardiopulmonary distress.  HEENT: No facial asymmetry, EOMI,     Neck supple .  Chest: Clear to auscultation bilaterally.  CVS: S1, S2 no murmurs, no S3.Regular rate.  ABD: Soft non tender.   Ext: No edema  MS: Adequate ROM spine, shoulders, hips and knees.  Skin: Intact, no ulcerations or rash noted.  Psych: Good eye contact, normal affect. Memory intact not anxious or depressed appearing.  CNS: CN 2-12 intact, power,  normal throughout.no focal deficits noted.   Assessment & Plan  Essential hypertension Controlled, no change in medication DASH diet and commitment to daily physical activity for a minimum of 30 minutes discussed and encouraged, as a part of  hypertension management. The importance of attaining a healthy weight is also discussed.  BP/Weight 06/18/2020 01/14/2020 07/09/2019 08/24/2018 02/21/2018 6/78/9381 0/09/7508  Systolic BP 258 527 782 423 536 144 315  Diastolic BP 82 80 80 78 80 85 80  Wt. (Lbs) 203 206 206 206 196.12 - 206  BMI 32.77 33.25 33.25 33.25 31.65 - 33.25       Hyperlipemia Hyperlipidemia:Low fat diet discussed and encouraged.   Lipid Panel  Lab Results  Component Value Date   CHOL 189 06/12/2020   HDL 51 06/12/2020   LDLCALC 122 (H) 06/12/2020   TRIG 89 06/12/2020   CHOLHDL 3.7 06/12/2020   Needs to reduce fried and fatty foods    Obesity (BMI 30.0-34.9)  Patient re-educated about  the importance of commitment to a  minimum of 150 minutes of exercise per week as able.  The importance of healthy food choices with portion control discussed, as well as eating regularly and within a 12 hour window most days. The need to choose "clean , green" food 50 to 75% of the time is discussed, as well as to make water the primary drink and set a goal of 64 ounces water daily.    Weight /BMI 06/18/2020 01/14/2020 07/09/2019  WEIGHT 203 lb 206 lb 206 lb  HEIGHT 5\' 6"  5\' 6"  5\' 6"   BMI 32.77 kg/m2 33.25 kg/m2 33.25 kg/m2      Tubular adenoma of colon Will follow through with colonscopy  IGT (impaired glucose tolerance) Improved  which is great Patient educated about the importance of limiting  Carbohydrate intake , the need to commit to daily physical activity for a minimum of 30 minutes , and to commit weight loss. The fact that changes in all these areas will reduce or eliminate all together the development of diabetes is stressed.   Diabetic Labs Latest Ref Rng & Units 06/12/2020 07/09/2019 01/10/2019 08/15/2018 02/21/2018  HbA1c 4.8 - 5.6 % 5.9(H) - 6.2(H) 6.2(H) -  Chol 100 - 199 mg/dL 189 156 - 208(H) -  HDL >39 mg/dL 51 43(L) - 59 -  Calc LDL 0 - 99 mg/dL 122(H) 97 - 133(H) -  Triglycerides 0 - 149 mg/dL  89 75 - 66 -  Creatinine 0.57 - 1.00 mg/dL 0.82 0.75 0.76 0.67 0.68   BP/Weight 06/18/2020 01/14/2020 07/09/2019 08/24/2018 02/21/2018 7/67/3419 11/25/9022  Systolic BP 097 353 299 242 683 419 622  Diastolic BP 82 80 80 78 80 85 80  Wt. (Lbs) 203 206 206 206 196.12 - 206  BMI 32.77 33.25 33.25 33.25 31.65 - 33.25   No flowsheet data found.    Cigarette nicotine dependence with nicotine-induced disorder Asked:confirms currently smokes cigarettes 2 to 5 / day Assess: Unwilling to set a quit date, but is cutting back Advise: needs to QUIT to reduce risk of cancer, cardio and cerebrovascular disease Assist: counseled for 5 minutes and literature provided Arrange: follow up in 2 to 4 months   Vitamin D deficiency Inadequately supplemented , needs to commit to daily vit D

## 2020-06-18 NOTE — Assessment & Plan Note (Signed)
Asked:confirms currently smokes cigarettes 2 to 5 / day Assess: Unwilling to set a quit date, but is cutting back Advise: needs to QUIT to reduce risk of cancer, cardio and cerebrovascular disease Assist: counseled for 5 minutes and literature provided Arrange: follow up in 2 to 4 months

## 2020-06-18 NOTE — Assessment & Plan Note (Signed)
Hyperlipidemia:Low fat diet discussed and encouraged.   Lipid Panel  Lab Results  Component Value Date   CHOL 189 06/12/2020   HDL 51 06/12/2020   LDLCALC 122 (H) 06/12/2020   TRIG 89 06/12/2020   CHOLHDL 3.7 06/12/2020   Needs to reduce fried and fatty foods

## 2020-06-18 NOTE — Assessment & Plan Note (Signed)
Improved which is great Patient educated about the importance of limiting  Carbohydrate intake , the need to commit to daily physical activity for a minimum of 30 minutes , and to commit weight loss. The fact that changes in all these areas will reduce or eliminate all together the development of diabetes is stressed.   Diabetic Labs Latest Ref Rng & Units 06/12/2020 07/09/2019 01/10/2019 08/15/2018 02/21/2018  HbA1c 4.8 - 5.6 % 5.9(H) - 6.2(H) 6.2(H) -  Chol 100 - 199 mg/dL 189 156 - 208(H) -  HDL >39 mg/dL 51 43(L) - 59 -  Calc LDL 0 - 99 mg/dL 122(H) 97 - 133(H) -  Triglycerides 0 - 149 mg/dL 89 75 - 66 -  Creatinine 0.57 - 1.00 mg/dL 0.82 0.75 0.76 0.67 0.68   BP/Weight 06/18/2020 01/14/2020 07/09/2019 08/24/2018 02/21/2018 04/14/2034 01/27/7415  Systolic BP 384 536 468 032 122 482 500  Diastolic BP 82 80 80 78 80 85 80  Wt. (Lbs) 203 206 206 206 196.12 - 206  BMI 32.77 33.25 33.25 33.25 31.65 - 33.25   No flowsheet data found.

## 2020-06-18 NOTE — Assessment & Plan Note (Signed)
Inadequately supplemented , needs to commit to daily vit D

## 2020-06-18 NOTE — Assessment & Plan Note (Signed)
  Patient re-educated about  the importance of commitment to a  minimum of 150 minutes of exercise per week as able.  The importance of healthy food choices with portion control discussed, as well as eating regularly and within a 12 hour window most days. The need to choose "clean , green" food 50 to 75% of the time is discussed, as well as to make water the primary drink and set a goal of 64 ounces water daily.    Weight /BMI 06/18/2020 01/14/2020 07/09/2019  WEIGHT 203 lb 206 lb 206 lb  HEIGHT 5\' 6"  5\' 6"  5\' 6"   BMI 32.77 kg/m2 33.25 kg/m2 33.25 kg/m2

## 2020-06-18 NOTE — Assessment & Plan Note (Signed)
Will follow through with colonscopy

## 2020-06-18 NOTE — Patient Instructions (Signed)
Annual physical exam in office with MD in February, call if you need me before  Please schedule mammogram at [facility] at checkout  Please reduce fat in your diet so bad cholesterol impproves  Great work wit blood sugar falling , keep it up!   Fasting lipid, cmp and eGFr 3 days before next appointment   Please ensure you get your colonoscopy, very important and overdue  Labs and BP are excellent, no med change  NEW start OTC vitamin D3, 1000 IU take THREE daily, or 5000IU one daily  Please try to stop smoking, even though you smoke very little and for nerves  It is important that you exercise regularly at least 30 minutes 5 times a week. If you develop chest pain, have severe difficulty breathing, or feel very tired, stop exercising immediately and seek medical attention  Think about what you will eat, plan ahead. Choose " clean, green, fresh or frozen" over canned, processed or packaged foods which are more sugary, salty and fatty. 70 to 75% of food eaten should be vegetables and fruit. Three meals at set times with snacks allowed between meals, but they must be fruit or vegetables. Aim to eat over a 12 hour period , example 7 am to 7 pm, and STOP after  your last meal of the day. Drink water,generally about 64 ounces per day, no other drink is as healthy. Fruit juice is best enjoyed in a healthy way, by EATING the fruit.   Thanks for choosing [facility], we consider it a privelige to serve you.     

## 2020-06-19 ENCOUNTER — Encounter (INDEPENDENT_AMBULATORY_CARE_PROVIDER_SITE_OTHER): Payer: Self-pay | Admitting: *Deleted

## 2020-06-23 ENCOUNTER — Encounter (INDEPENDENT_AMBULATORY_CARE_PROVIDER_SITE_OTHER): Payer: Self-pay

## 2020-06-24 ENCOUNTER — Ambulatory Visit: Payer: Medicare HMO | Admitting: Family Medicine

## 2020-07-03 DIAGNOSIS — Z20822 Contact with and (suspected) exposure to covid-19: Secondary | ICD-10-CM | POA: Diagnosis not present

## 2020-07-16 ENCOUNTER — Ambulatory Visit: Payer: Medicare HMO | Admitting: Family Medicine

## 2020-08-04 ENCOUNTER — Ambulatory Visit (HOSPITAL_COMMUNITY): Payer: Medicare HMO

## 2020-08-18 ENCOUNTER — Ambulatory Visit (HOSPITAL_COMMUNITY)
Admission: RE | Admit: 2020-08-18 | Discharge: 2020-08-18 | Disposition: A | Discharge status: Home / Self Care | Payer: Medicare HMO | Source: Ambulatory Visit | Attending: Family Medicine | Admitting: Family Medicine

## 2020-08-18 ENCOUNTER — Other Ambulatory Visit: Payer: Self-pay

## 2020-08-18 DIAGNOSIS — Z1231 Encounter for screening mammogram for malignant neoplasm of breast: Secondary | ICD-10-CM | POA: Insufficient documentation

## 2020-10-30 ENCOUNTER — Encounter: Payer: Medicare HMO | Admitting: Family Medicine

## 2020-11-10 IMAGING — MG DIGITAL SCREENING BILAT W/ TOMO W/ CAD
6 of 10 series · 6 of 30 positions shown · non-contrast
Comparison: Previous exam(s).

CLINICAL DATA: Screening.

EXAM:
DIGITAL SCREENING BILATERAL MAMMOGRAM WITH TOMO AND CAD

[L CC synth-2D]
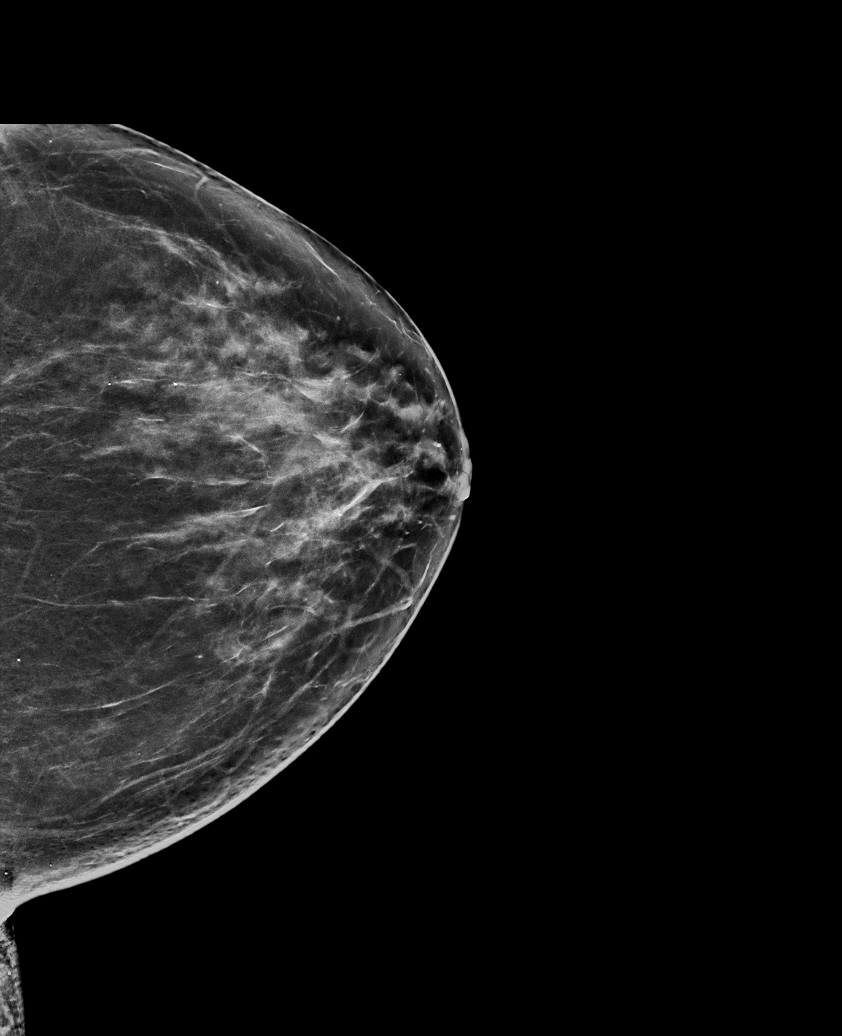

[L MLO synth-2D]
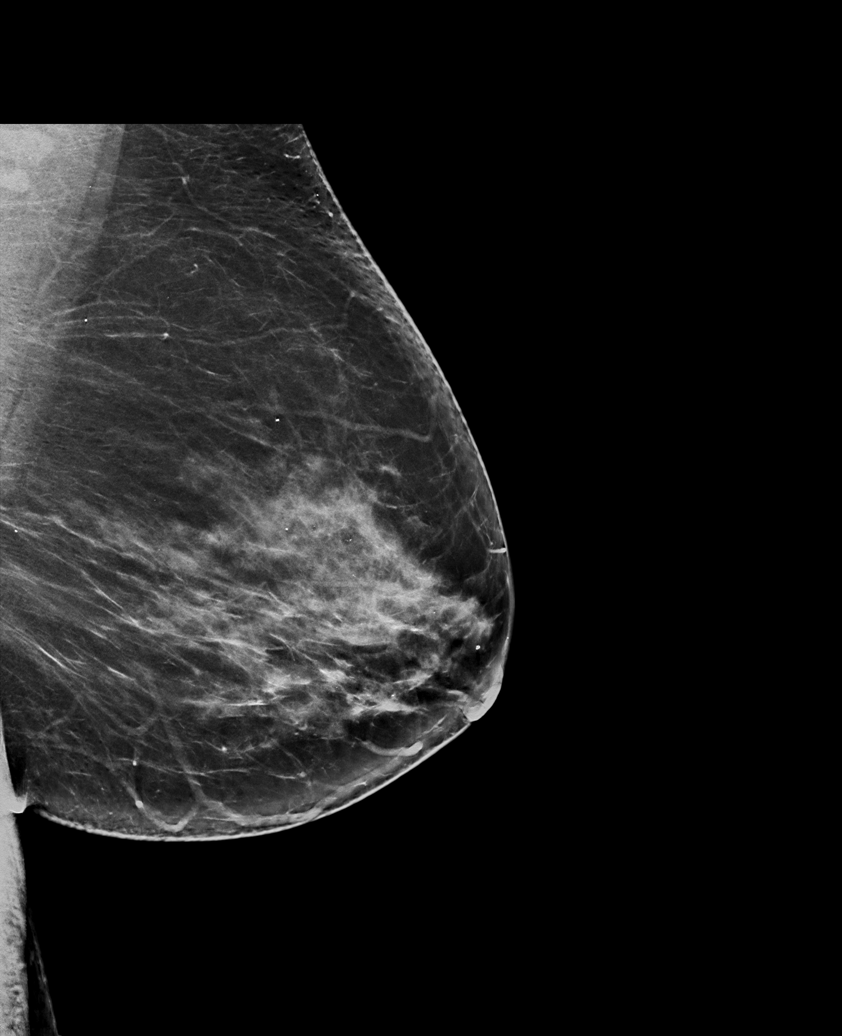

[R MLO synth-2D]
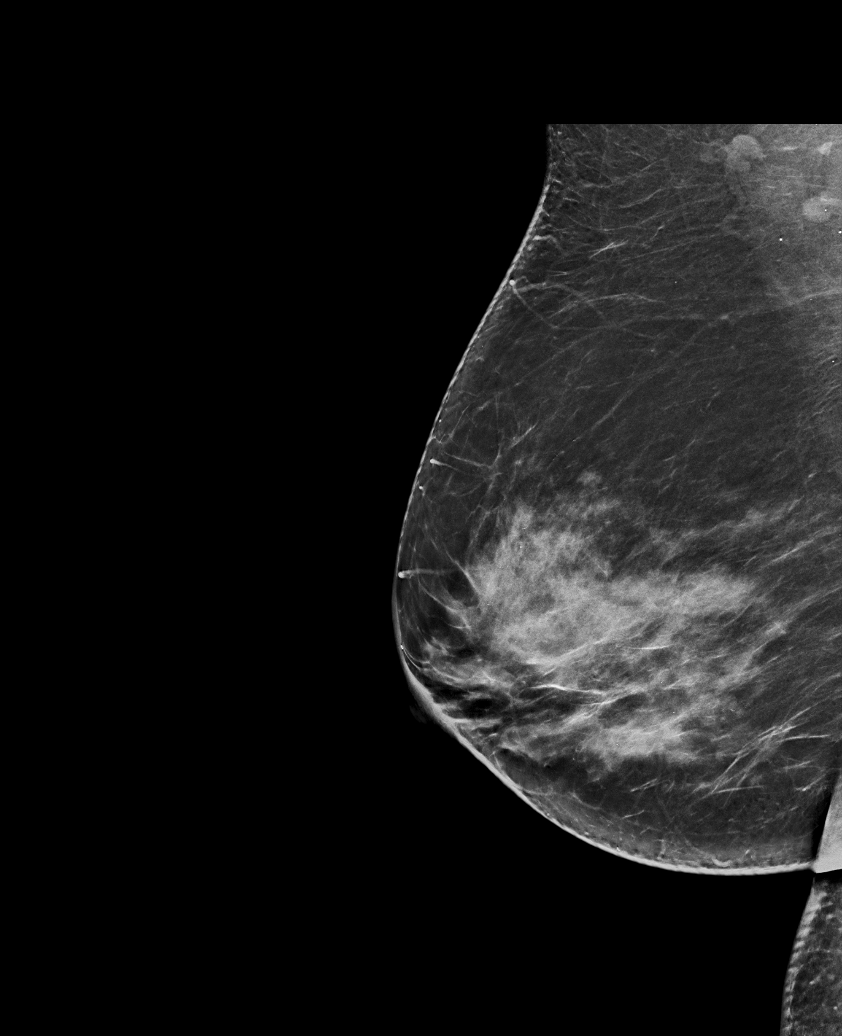

[R CC synth-2D (1 of 2)]
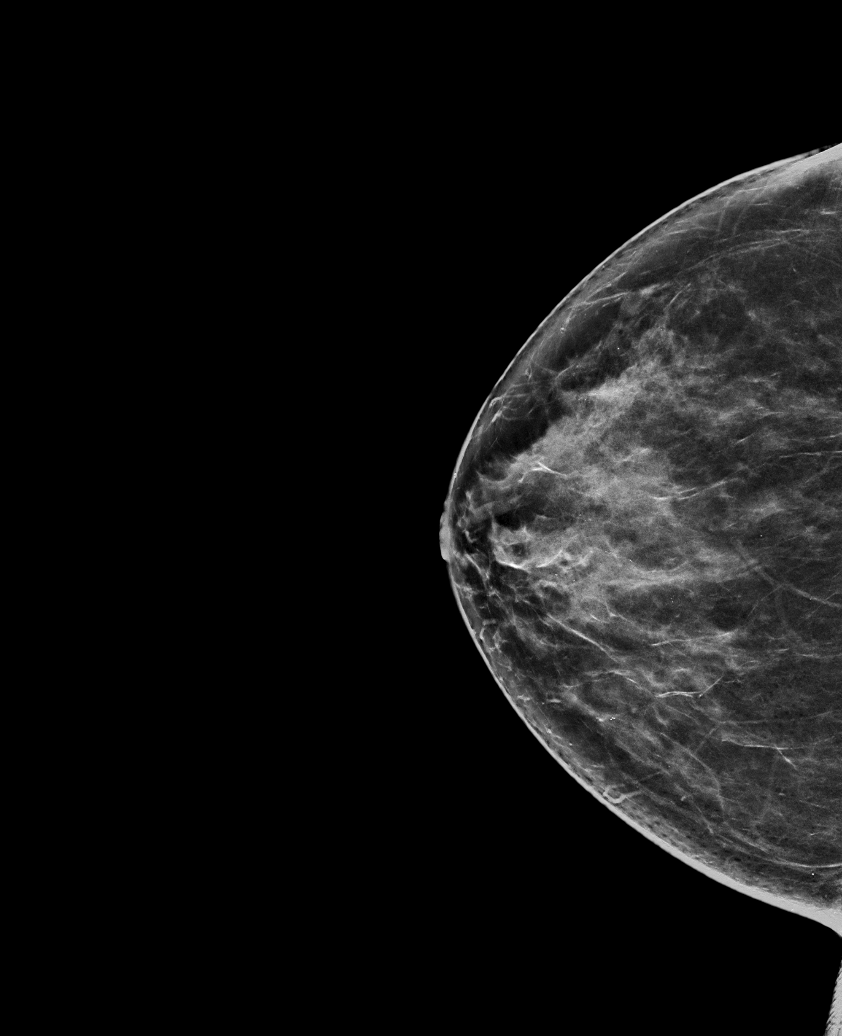

[R CC synth-2D (2 of 2)]
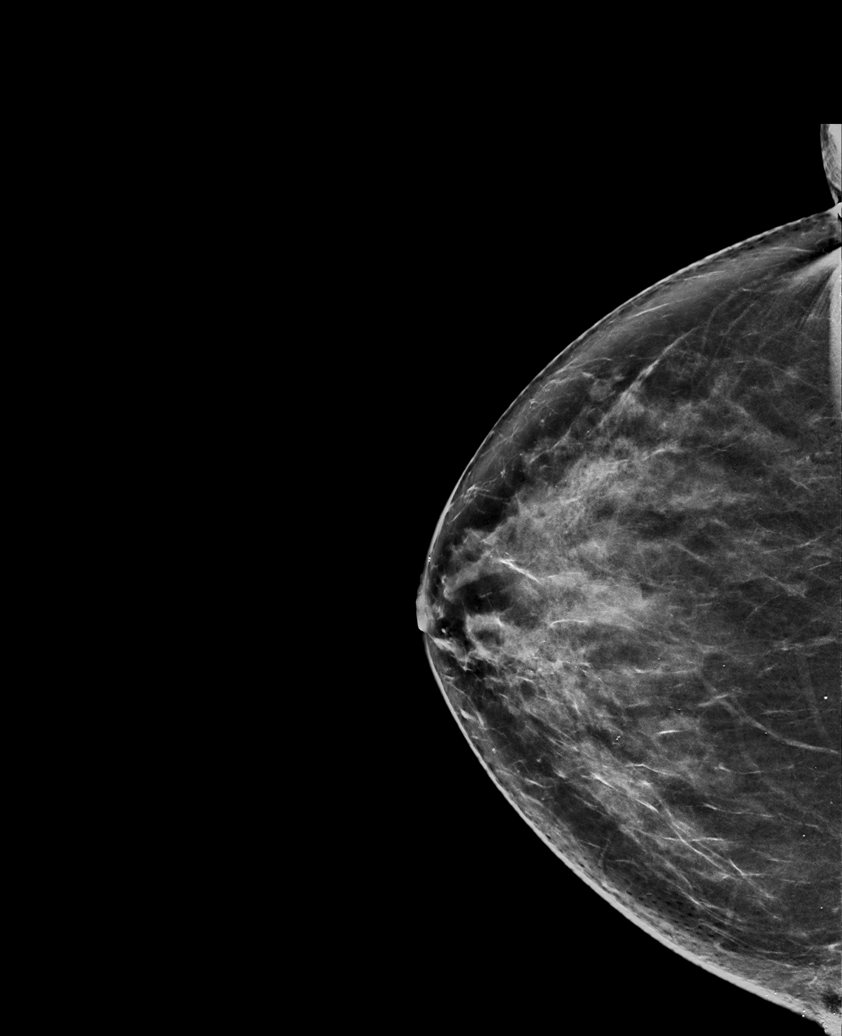

[L CC tomo · tomo slice 37/74.0]
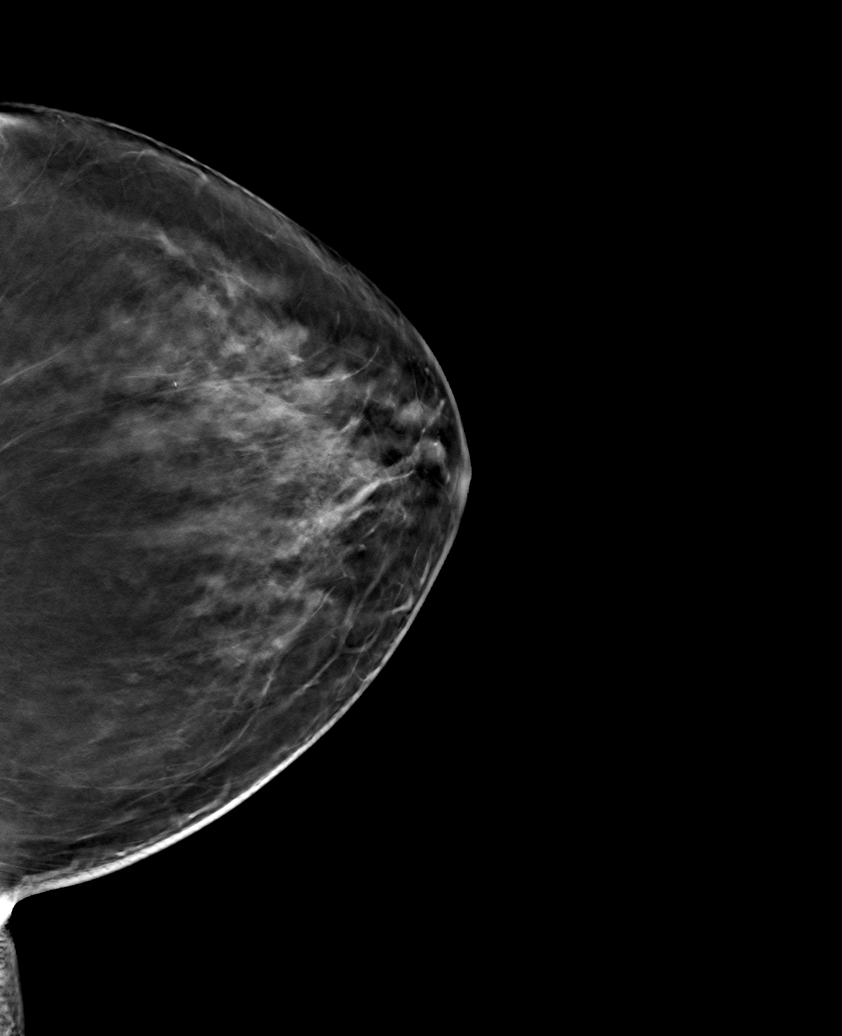

[6 of 30 positions shown; findings below may reference images not displayed]

ACR Breast Density Category c: The breast tissue is heterogeneously
dense, which may obscure small masses.
FINDINGS: There are no findings suspicious for malignancy. Images were
processed with CAD.
IMPRESSION: No mammographic evidence of malignancy. A result letter of this
screening mammogram will be mailed directly to the patient.

RECOMMENDATION:
Screening mammogram in one year. (Code:[id number])

BI-RADS CATEGORY  1: Negative.

## 2020-11-13 DIAGNOSIS — H524 Presbyopia: Secondary | ICD-10-CM | POA: Diagnosis not present

## 2021-01-12 NOTE — Progress Notes (Deleted)
Subjective:   Amanda Simon is a 70 y.o. female who presents for Medicare Annual (Subsequent) preventive examination.  Review of Systems    N/A        Objective:    There were no vitals filed for this visit. There is no height or weight on file to calculate BMI.  Advanced Directives 01/14/2020 06/29/2017 02/20/2015  Does Patient Have a Medical Advance Directive? No No No  Would patient like information on creating a medical advance directive? Yes (ED - Information included in AVS) Yes (MAU/Ambulatory/Procedural Areas - Information given) No - patient declined information    Current Medications (verified) Outpatient Encounter Medications as of 01/14/2021  Medication Sig  . aspirin 81 MG chewable tablet Chew 81 mg by mouth daily.  Marland Kitchen losartan-hydrochlorothiazide (HYZAAR) 100-25 MG tablet Take 1 tablet by mouth daily.  . simvastatin (ZOCOR) 40 MG tablet Take 1 tablet (40 mg total) by mouth at bedtime.   No facility-administered encounter medications on file as of 01/14/2021.    Allergies (verified) Metformin and related   History: Past Medical History:  Diagnosis Date  . Depression   . Depression, major, single episode, moderate ([place]) 08/27/2018   PHQ 9 score of 14 in 08/2018, not suicidal or homicidal, medication started and referred to telethewrapy  . Hyperlipidemia   . Hypertension   . IGT (impaired glucose tolerance) 2014  . Obesity   . Osteopenia    Past Surgical History:  Procedure Laterality Date  . ABDOMINAL HYSTERECTOMY  1975   fibroids  . COLONOSCOPY N/A 02/20/2015   Procedure: COLONOSCOPY;  Surgeon: Rogene Houston, MD;  Location: [facility];  Service: Endoscopy;  Laterality: N/A;  930  . PARTIAL HYSTERECTOMY    . TUBAL LIGATION     Family History  Problem Relation Age of Onset  . Lung cancer Mother   . Aneurysm Father        brain   . Hypertension Sister   . Hypertension Sister    Social History   Socioeconomic History  . Marital status: Married     Spouse name: Not on file  . Number of children: Not on file  . Years of education: Not on file  . Highest education level: Not on file  Occupational History  . Occupation: unemployed   Tobacco Use  . Smoking status: Current Some Day Smoker    Packs/day: 0.25    Years: 35.00    Pack years: 8.75    Types: Cigarettes    Last attempt to quit: 03/01/2017    Years since quitting: 3.8  . Smokeless tobacco: Never Used  . Tobacco comment: 2 per day   Substance and Sexual Activity  . Alcohol use: No    Alcohol/week: 0.0 standard drinks  . Drug use: No  . Sexual activity: Yes    Birth control/protection: Surgical  Other Topics Concern  . Not on file  Social History Narrative  . Not on file   Social Determinants of Health   Financial Resource Strain: Low Risk   . Difficulty of Paying Living Expenses: Not hard at all  Food Insecurity: No Food Insecurity  . Worried About Charity fundraiser in the Last Year: Never true  . Ran Out of Food in the Last Year: Never true  Transportation Needs: No Transportation Needs  . Lack of Transportation (Medical): No  . Lack of Transportation (Non-Medical): No  Physical Activity: Sufficiently Active  . Days of Exercise per Week: 7 days  .  Minutes of Exercise per Session: 40 min  Stress: No Stress Concern Present  . Feeling of Stress : Not at all  Social Connections: Moderately Integrated  . Frequency of Communication with Friends and Family: More than three times a week  . Frequency of Social Gatherings with Friends and Family: Twice a week  . Attends Religious Services: More than 4 times per year  . Active Member of Clubs or Organizations: No  . Attends Archivist Meetings: Never  . Marital Status: Married    Tobacco Counseling Ready to quit: Not Answered Counseling given: Not Answered Comment: 2 per day    Clinical Intake:                 Diabetic?No          Activities of Daily Living In your present  state of health, do you have any difficulty performing the following activities: 01/14/2020 01/14/2020  Hearing? N N  Vision? N N  Difficulty concentrating or making decisions? N N  Walking or climbing stairs? N N  Dressing or bathing? N N  Doing errands, shopping? N N  Preparing Food and eating ? N -  Using the Toilet? N -  In the past six months, have you accidently leaked urine? Y -  Do you have problems with loss of bowel control? N -  Managing your Medications? N -  Managing your Finances? N -  Housekeeping or managing your Housekeeping? N -  Some recent data might be hidden    Patient Care Team: Fayrene Helper, MD as PCP - General  Indicate any recent Medical Services you may have received from other than [facility] providers in the past year (date may be approximate).     Assessment:   This is a routine wellness examination for Amanda Simon.  Hearing/Vision screen No exam data present  Dietary issues and exercise activities discussed:    Goals    . Exercise 3x per week (30 min per time)     Recommend increasing your exercise program to at least 3 days a week for 30-45 minutes at a time as tolerated.      Marland Kitchen LIFESTYLE - DECREASE FALLS RISK    . Quit Smoking     Patient down to 2-3 per day       Depression Screen PHQ 2/9 Scores 01/14/2020 01/14/2020 07/09/2019 01/09/2019 08/24/2018 08/24/2018 10/25/2017  PHQ - 2 Score 0 0 0 0 4 1 0  PHQ- 9 Score 0 - - - 14 2 -    Fall Risk Fall Risk  06/18/2020 01/14/2020 07/09/2019 01/09/2019 08/24/2018  Falls in the past year? 0 0 0 0 0  Number falls in past yr: 0 0 0 - -  Injury with Fall? 0 0 0 0 -    FALL RISK PREVENTION PERTAINING TO THE HOME:  Any stairs in or around the home? {YES/NO:21197} If so, are there any without handrails? No  Home free of loose throw rugs in walkways, pet beds, electrical cords, etc? Yes  Adequate lighting in your home to reduce risk of falls? Yes   ASSISTIVE DEVICES UTILIZED TO PREVENT FALLS:  Life  alert? {YES/NO:21197} Use of a cane, walker or w/c? {YES/NO:21197} Grab bars in the bathroom? {YES/NO:21197} Shower chair or bench in shower? {YES/NO:21197} Elevated toilet seat or a handicapped toilet? {YES/NO:21197}  TIMED UP AND GO:  Was the test performed? Yes .  Length of time to ambulate 10 feet: *** sec.   {Appearance of  ZOXW:9604540}  Cognitive Function:     6CIT Screen 01/14/2020 01/09/2019 06/29/2017  What Year? 0 points 0 points 0 points  What month? 0 points 0 points 0 points  What time? 0 points 0 points 0 points  Count back from 20 0 points 0 points 0 points  Months in reverse 0 points 0 points 0 points  Repeat phrase 0 points 0 points 0 points  Total Score 0 0 0    Immunizations Immunization History  Administered Date(s) Administered  . Fluad Quad(high Dose 65+) 06/18/2020  . Influenza Whole 08/02/2011  . Influenza, High Dose Seasonal PF 08/24/2018  . Influenza,inj,Quad PF,6+ Mos 06/11/2013, 09/24/2014, 07/22/2015, 08/19/2016, 06/29/2017  . PFIZER(Purple Top)SARS-COV-2 Vaccination 11/01/2019, 11/26/2019  . Pneumococcal Conjugate-13 02/05/2015  . Pneumococcal Polysaccharide-23 10/21/2016  . Tdap 12/31/2010  . Zoster 12/31/2010    TDAP status: Due, Education has been provided regarding the importance of this vaccine. Advised may receive this vaccine at local pharmacy or Health Dept. Aware to provide a copy of the vaccination record if obtained from local pharmacy or Health Dept. Verbalized acceptance and understanding.  Flu Vaccine status: Up to date  Pneumococcal vaccine status: Up to date  Covid-19 vaccine status: Completed vaccines  Qualifies for Shingles Vaccine? Yes   Zostavax completed Yes   Shingrix Completed?: No.    Education has been provided regarding the importance of this vaccine. Patient has been advised to call insurance company to determine out of pocket expense if they have not yet received this vaccine. Advised may also receive vaccine at  local pharmacy or Health Dept. Verbalized acceptance and understanding.  Screening Tests Health Maintenance  Topic Date Due  . COLONOSCOPY (Pts 45-62yrs Insurance coverage will need to be confirmed)  02/20/2020  . COVID-19 Vaccine (3 - Booster for Pfizer series) 05/28/2020  . TETANUS/TDAP  12/30/2020  . INFLUENZA VACCINE  04/20/2021  . MAMMOGRAM  08/18/2022  . DEXA SCAN  Completed  . Hepatitis C Screening  Completed  . PNA vac Low Risk Adult  Completed  . HPV VACCINES  Aged Out    Health Maintenance  Health Maintenance Due  Topic Date Due  . COLONOSCOPY (Pts 45-34yrs Insurance coverage will need to be confirmed)  02/20/2020  . COVID-19 Vaccine (3 - Booster for Pfizer series) 05/28/2020  . TETANUS/TDAP  12/30/2020    Colorectal cancer screening: Referral to GI placed 01/14/2021. Pt aware the office will call re: appt.  Mammogram status: Completed 08/18/2020. Repeat every year  Bone Density status: Completed 08/03/2019. Results reflect: Bone density results: NORMAL. Repeat every 0 years.  Lung Cancer Screening: (Low Dose CT Chest recommended if Age 73-80 years, 30 pack-year currently smoking OR have quit w/in 15years.) does not qualify.   Lung Cancer Screening Referral: N/A   Additional Screening:  Hepatitis C Screening: does qualify; Completed 07/02/2015  Vision Screening: Recommended annual ophthalmology exams for early detection of glaucoma and other disorders of the eye. Is the patient up to date with their annual eye exam?  {YES/NO:21197} Who is the provider or what is the name of the office in which the patient attends annual eye exams? *** If pt is not established with a provider, would they like to be referred to a provider to establish care? {YES/NO:21197}.   Dental Screening: Recommended annual dental exams for proper oral hygiene  Community Resource Referral / Chronic Care Management: CRR required this visit?  No   CCM required this visit?  No      Plan:  I have personally reviewed and noted the following in the patient's chart:   . Medical and social history . Use of alcohol, tobacco or illicit drugs  . Current medications and supplements . Functional ability and status . Nutritional status . Physical activity . Advanced directives . List of other physicians . Hospitalizations, surgeries, and ER visits in previous 12 months . Vitals . Screenings to include cognitive, depression, and falls . Referrals and appointments  In addition, I have reviewed and discussed with patient certain preventive protocols, quality metrics, and best practice recommendations. A written personalized care plan for preventive services as well as general preventive health recommendations were provided to patient.     Ofilia Neas, LPN   [id number]   Nurse Notes: None

## 2021-01-14 ENCOUNTER — Ambulatory Visit: Payer: Medicare HMO

## 2021-01-14 DIAGNOSIS — Z0000 Encounter for general adult medical examination without abnormal findings: Secondary | ICD-10-CM

## 2021-01-15 ENCOUNTER — Other Ambulatory Visit: Payer: Self-pay

## 2021-01-15 DIAGNOSIS — E782 Mixed hyperlipidemia: Secondary | ICD-10-CM

## 2021-01-15 DIAGNOSIS — I10 Essential (primary) hypertension: Secondary | ICD-10-CM

## 2021-01-27 NOTE — Progress Notes (Signed)
Subjective:   Amanda Simon is a 70 y.o. female who presents for Medicare Annual (Subsequent) preventive examination.  I connected with Harl Favor today by telephone and verified that I am speaking with the correct person using two identifiers. Location patient: home Location provider: work Persons participating in the virtual visit: patient, provider.   I discussed the limitations, risks, security and privacy concerns of performing an evaluation and management service by telephone and the availability of in person appointments. I also discussed with the patient that there may be a patient responsible charge related to this service. The patient expressed understanding and verbally consented to this telephonic visit.    Interactive audio and video telecommunications were attempted between this provider and patient, however failed, due to patient having technical difficulties OR patient did not have access to video capability.  We continued and completed visit with audio only.      Review of Systems    N/A  Cardiac Risk Factors include: advanced age (>50men, >30 women);hypertension;dyslipidemia     Objective:    Today's Vitals   There is no height or weight on file to calculate BMI.  Advanced Directives 01/28/2021 01/14/2020 06/29/2017 02/20/2015  Does Patient Have a Medical Advance Directive? No No No No  Would patient like information on creating a medical advance directive? No - Patient declined Yes (ED - Information included in AVS) Yes (MAU/Ambulatory/Procedural Areas - Information given) No - patient declined information    Current Medications (verified) Outpatient Encounter Medications as of 01/28/2021  Medication Sig  . aspirin 81 MG chewable tablet Chew 81 mg by mouth daily.  Marland Kitchen losartan-hydrochlorothiazide (HYZAAR) 100-25 MG tablet Take 1 tablet by mouth daily.  . simvastatin (ZOCOR) 40 MG tablet Take 1 tablet (40 mg total) by mouth at bedtime.   No  facility-administered encounter medications on file as of 01/28/2021.    Allergies (verified) Metformin and related   History: Past Medical History:  Diagnosis Date  . Depression   . Depression, major, single episode, moderate ([place]) 08/27/2018   PHQ 9 score of 14 in 08/2018, not suicidal or homicidal, medication started and referred to telethewrapy  . Hyperlipidemia   . Hypertension   . IGT (impaired glucose tolerance) 2014  . Obesity   . Osteopenia    Past Surgical History:  Procedure Laterality Date  . ABDOMINAL HYSTERECTOMY  1975   fibroids  . COLONOSCOPY N/A 02/20/2015   Procedure: COLONOSCOPY;  Surgeon: Rogene Houston, MD;  Location: [facility];  Service: Endoscopy;  Laterality: N/A;  930  . PARTIAL HYSTERECTOMY    . TUBAL LIGATION     Family History  Problem Relation Age of Onset  . Lung cancer Mother   . Aneurysm Father        brain   . Hypertension Sister   . Hypertension Sister    Social History   Socioeconomic History  . Marital status: Married    Spouse name: Not on file  . Number of children: Not on file  . Years of education: Not on file  . Highest education level: Not on file  Occupational History  . Occupation: unemployed   Tobacco Use  . Smoking status: Current Some Day Smoker    Packs/day: 0.25    Years: 35.00    Pack years: 8.75    Types: Cigarettes    Last attempt to quit: 03/01/2017    Years since quitting: 3.9  . Smokeless tobacco: Never Used  . Tobacco comment: 2  per day   Substance and Sexual Activity  . Alcohol use: No    Alcohol/week: 0.0 standard drinks  . Drug use: No  . Sexual activity: Yes    Birth control/protection: Surgical  Other Topics Concern  . Not on file  Social History Narrative  . Not on file   Social Determinants of Health   Financial Resource Strain: Low Risk   . Difficulty of Paying Living Expenses: Not hard at all  Food Insecurity: No Food Insecurity  . Worried About Charity fundraiser in the Last Year:  Never true  . Ran Out of Food in the Last Year: Never true  Transportation Needs: No Transportation Needs  . Lack of Transportation (Medical): No  . Lack of Transportation (Non-Medical): No  Physical Activity: Insufficiently Active  . Days of Exercise per Week: 7 days  . Minutes of Exercise per Session: 20 min  Stress: No Stress Concern Present  . Feeling of Stress : Not at all  Social Connections: Moderately Integrated  . Frequency of Communication with Friends and Family: Twice a week  . Frequency of Social Gatherings with Friends and Family: Once a week  . Attends Religious Services: 1 to 4 times per year  . Active Member of Clubs or Organizations: No  . Attends Archivist Meetings: Never  . Marital Status: Married    Tobacco Counseling Ready to quit: Not Answered Counseling given: Not Answered Comment: 2 per day    Clinical Intake:  Pre-visit preparation completed: Yes  Pain : No/denies pain     Nutritional Risks: None Diabetes: No  How often do you need to have someone help you when you read instructions, pamphlets, or other written materials from your doctor or pharmacy?: 1 - Never  Diabetic?No   Interpreter Needed?: No  Information entered by :: [place] of Daily Living In your present state of health, do you have any difficulty performing the following activities: 01/28/2021  Hearing? N  Vision? N  Difficulty concentrating or making decisions? N  Walking or climbing stairs? N  Dressing or bathing? N  Doing errands, shopping? N  Preparing Food and eating ? N  Using the Toilet? N  In the past six months, have you accidently leaked urine? N  Do you have problems with loss of bowel control? N  Managing your Medications? N  Managing your Finances? N  Housekeeping or managing your Housekeeping? N  Some recent data might be hidden    Patient Care Team: Fayrene Helper, MD as PCP - General  Indicate any recent Medical  Services you may have received from other than [facility] providers in the past year (date may be approximate).     Assessment:   This is a routine wellness examination for [place].  Hearing/Vision screen  Hearing Screening   125Hz  250Hz  500Hz  1000Hz  2000Hz  3000Hz  4000Hz  6000Hz  8000Hz   Right ear:           Left ear:           Vision Screening Comments: Patient states gets eye exams once per year. Patient currently wears glasses.  Dietary issues and exercise activities discussed: Current Exercise Habits: Home exercise routine, Type of exercise: walking;stretching, Time (Minutes): 20, Frequency (Times/Week): 7, Weekly Exercise (Minutes/Week): 140, Intensity: Mild, Exercise limited by: None identified  Goals Addressed            This Visit's Progress   . Weight (lb) < 190 lb (86.2 kg)  Depression Screen PHQ 2/9 Scores 01/28/2021 01/14/2020 01/14/2020 07/09/2019 01/09/2019 08/24/2018 08/24/2018  PHQ - 2 Score 0 0 0 0 0 4 1  PHQ- 9 Score - 0 - - - 14 2    Fall Risk Fall Risk  01/28/2021 06/18/2020 01/14/2020 07/09/2019 01/09/2019  Falls in the past year? 0 0 0 0 0  Number falls in past yr: 0 0 0 0 -  Injury with Fall? 0 0 0 0 0  Risk for fall due to : No Fall Risks - - - -  Follow up Falls evaluation completed;Falls prevention discussed - - - -    FALL RISK PREVENTION PERTAINING TO THE HOME:  Any stairs in or around the home? Yes  If so, are there any without handrails? No  Home free of loose throw rugs in walkways, pet beds, electrical cords, etc? Yes  Adequate lighting in your home to reduce risk of falls? Yes   ASSISTIVE DEVICES UTILIZED TO PREVENT FALLS:  Life alert? No  Use of a cane, walker or w/c? No  Grab bars in the bathroom? No  Shower chair or bench in shower? No  Elevated toilet seat or a handicapped toilet? Yes    Cognitive Function:   Normal cognitive status assessed by direct observation by this Nurse Health Advisor. No abnormalities found.     6CIT Screen  01/14/2020 01/09/2019 06/29/2017  What Year? 0 points 0 points 0 points  What month? 0 points 0 points 0 points  What time? 0 points 0 points 0 points  Count back from 20 0 points 0 points 0 points  Months in reverse 0 points 0 points 0 points  Repeat phrase 0 points 0 points 0 points  Total Score 0 0 0    Immunizations Immunization History  Administered Date(s) Administered  . Fluad Quad(high Dose 65+) 05/24/2019, 06/18/2020  . Influenza Whole 08/02/2011  . Influenza, High Dose Seasonal PF 08/24/2018  . Influenza,inj,Quad PF,6+ Mos 06/11/2013, 09/24/2014, 07/22/2015, 08/19/2016, 06/29/2017  . PFIZER(Purple Top)SARS-COV-2 Vaccination 11/01/2019, 11/26/2019  . Pneumococcal Conjugate-13 02/05/2015  . Pneumococcal Polysaccharide-23 10/21/2016  . Tdap 12/31/2010  . Zoster 12/31/2010    TDAP status: Up to date  Flu Vaccine status: Up to date  Pneumococcal vaccine status: Up to date  Covid-19 vaccine status: Completed vaccines  Qualifies for Shingles Vaccine? Yes   Zostavax completed Yes   Shingrix Completed?: No.    Education has been provided regarding the importance of this vaccine. Patient has been advised to call insurance company to determine out of pocket expense if they have not yet received this vaccine. Advised may also receive vaccine at local pharmacy or Health Dept. Verbalized acceptance and understanding.  Screening Tests Health Maintenance  Topic Date Due  . COLONOSCOPY (Pts 45-52yrs Insurance coverage will need to be confirmed)  02/20/2020  . COVID-19 Vaccine (3 - Booster for Pfizer series) 05/28/2020  . TETANUS/TDAP  12/30/2020  . INFLUENZA VACCINE  04/20/2021  . MAMMOGRAM  08/18/2021  . DEXA SCAN  Completed  . Hepatitis C Screening  Completed  . PNA vac Low Risk Adult  Completed  . HPV VACCINES  Aged Out    Health Maintenance  Health Maintenance Due  Topic Date Due  . COLONOSCOPY (Pts 45-43yrs Insurance coverage will need to be confirmed)  02/20/2020   . COVID-19 Vaccine (3 - Booster for Pfizer series) 05/28/2020  . TETANUS/TDAP  12/30/2020    Colorectal cancer screening: Referral to GI placed 01/28/2021. Pt aware the office will call  re: appt.  Mammogram status: Completed 08/18/2020. Repeat every year  Bone Density status: Completed 08/03/2019. Results reflect: Bone density results: NORMAL. Repeat every 0 years.  Lung Cancer Screening: (Low Dose CT Chest recommended if Age 50-80 years, 30 pack-year currently smoking OR have quit w/in 15years.) does not qualify.   Lung Cancer Screening Referral: N/A   Additional Screening:  Hepatitis C Screening: does qualify; Completed 07/02/2015  Vision Screening: Recommended annual ophthalmology exams for early detection of glaucoma and other disorders of the eye. Is the patient up to date with their annual eye exam?  Yes  Who is the provider or what is the name of the office in which the patient attends annual eye exams? [facility] If pt is not established with a provider, would they like to be referred to a provider to establish care? No .   Dental Screening: Recommended annual dental exams for proper oral hygiene  Community Resource Referral / Chronic Care Management: CRR required this visit?  No   CCM required this visit?  No      Plan:     I have personally reviewed and noted the following in the patient's chart:   . Medical and social history . Use of alcohol, tobacco or illicit drugs  . Current medications and supplements including opioid prescriptions.  . Functional ability and status . Nutritional status . Physical activity . Advanced directives . List of other physicians . Hospitalizations, surgeries, and ER visits in previous 12 months . Vitals . Screenings to include cognitive, depression, and falls . Referrals and appointments  In addition, I have reviewed and discussed with patient certain preventive protocols, quality metrics, and best practice  recommendations. A written personalized care plan for preventive services as well as general preventive health recommendations were provided to patient.     Ofilia Neas, LPN   6/38/7564   Nurse Notes: None

## 2021-01-28 ENCOUNTER — Other Ambulatory Visit: Payer: Self-pay

## 2021-01-28 ENCOUNTER — Ambulatory Visit (INDEPENDENT_AMBULATORY_CARE_PROVIDER_SITE_OTHER): Payer: Medicare HMO

## 2021-01-28 DIAGNOSIS — Z0000 Encounter for general adult medical examination without abnormal findings: Secondary | ICD-10-CM | POA: Diagnosis not present

## 2021-01-28 DIAGNOSIS — Z1211 Encounter for screening for malignant neoplasm of colon: Secondary | ICD-10-CM | POA: Diagnosis not present

## 2021-01-28 NOTE — Patient Instructions (Signed)
Amanda Simon , Thank you for taking time to come for your Medicare Wellness Visit. I appreciate your ongoing commitment to your health goals. Please review the following plan we discussed and let me know if I can assist you in the future.   Screening recommendations/referrals: Colonoscopy: Currently due, orders placed this visit to Gastroenterology Mammogram: Up to date, next due 08/18/2021 Bone Density: No longer required  Recommended yearly ophthalmology/optometry visit for glaucoma screening and checkup Recommended yearly dental visit for hygiene and checkup  Vaccinations: Influenza vaccine: Up to date, next due fall 2022  Pneumococcal vaccine: Completed series Tdap vaccine: Currently due, you may await and injury to receive  Shingles vaccine: Currently due for Shingrix, if you would like to receive we recommend that you do so at your local pharmacy.     Advanced directives: Advance directive discussed with you today. Even though you declined this today please call our office should you change your mind and we can give you the proper paperwork for you to fill out.   Conditions/risks identified: None   Next appointment: 02/03/2022 @ 9:00 am with Nurse Health advisor    Preventive Care 72 Years and Older, Female Preventive care refers to lifestyle choices and visits with your health care provider that can promote health and wellness. What does preventive care include?  A yearly physical exam. This is also called an annual well check.  Dental exams once or twice a year.  Routine eye exams. Ask your health care provider how often you should have your eyes checked.  Personal lifestyle choices, including:  Daily care of your teeth and gums.  Regular physical activity.  Eating a healthy diet.  Avoiding tobacco and drug use.  Limiting alcohol use.  Practicing safe sex.  Taking low-dose aspirin every day.  Taking vitamin and mineral supplements as recommended by your health  care provider. What happens during an annual well check? The services and screenings done by your health care provider during your annual well check will depend on your age, overall health, lifestyle risk factors, and family history of disease. Counseling  Your health care provider may ask you questions about your:  Alcohol use.  Tobacco use.  Drug use.  Emotional well-being.  Home and relationship well-being.  Sexual activity.  Eating habits.  History of falls.  Memory and ability to understand (cognition).  Work and work Statistician.  Reproductive health. Screening  You may have the following tests or measurements:  Height, weight, and BMI.  Blood pressure.  Lipid and cholesterol levels. These may be checked every 5 years, or more frequently if you are over 72 years old.  Skin check.  Lung cancer screening. You may have this screening every year starting at age 72 if you have a 30-pack-year history of smoking and currently smoke or have quit within the past 15 years.  Fecal occult blood test (FOBT) of the stool. You may have this test every year starting at age 72.  Flexible sigmoidoscopy or colonoscopy. You may have a sigmoidoscopy every 5 years or a colonoscopy every 10 years starting at age 72.  Hepatitis C blood test.  Hepatitis B blood test.  Sexually transmitted disease (STD) testing.  Diabetes screening. This is done by checking your blood sugar (glucose) after you have not eaten for a while (fasting). You may have this done every 1-3 years.  Bone density scan. This is done to screen for osteoporosis. You may have this done starting at age 72.  Mammogram. This  may be done every 1-2 years. Talk to your health care provider about how often you should have regular mammograms. Talk with your health care provider about your test results, treatment options, and if necessary, the need for more tests. Vaccines  Your health care provider may recommend certain  vaccines, such as:  Influenza vaccine. This is recommended every year.  Tetanus, diphtheria, and acellular pertussis (Tdap, Td) vaccine. You may need a Td booster every 10 years.  Zoster vaccine. You may need this after age 49.  Pneumococcal 13-valent conjugate (PCV13) vaccine. One dose is recommended after age 72.  Pneumococcal polysaccharide (PPSV23) vaccine. One dose is recommended after age 72. Talk to your health care provider about which screenings and vaccines you need and how often you need them. This information is not intended to replace advice given to you by your health care provider. Make sure you discuss any questions you have with your health care provider. Document Released: 10/03/2015 Document Revised: 05/26/2016 Document Reviewed: 07/08/2015 Elsevier Interactive Patient Education  2017 [employer] Prevention in the Home Falls can cause injuries. They can happen to people of all ages. There are many things you can do to make your home safe and to help prevent falls. What can I do on the outside of my home?  Regularly fix the edges of walkways and driveways and fix any cracks.  Remove anything that might make you trip as you walk through a door, such as a raised step or threshold.  Trim any bushes or trees on the path to your home.  Use bright outdoor lighting.  Clear any walking paths of anything that might make someone trip, such as rocks or tools.  Regularly check to see if handrails are loose or broken. Make sure that both sides of any steps have handrails.  Any raised decks and porches should have guardrails on the edges.  Have any leaves, snow, or ice cleared regularly.  Use sand or salt on walking paths during winter.  Clean up any spills in your garage right away. This includes oil or grease spills. What can I do in the bathroom?  Use night lights.  Install grab bars by the toilet and in the tub and shower. Do not use towel bars as grab  bars.  Use non-skid mats or decals in the tub or shower.  If you need to sit down in the shower, use a plastic, non-slip stool.  Keep the floor dry. Clean up any water that spills on the floor as soon as it happens.  Remove soap buildup in the tub or shower regularly.  Attach bath mats securely with double-sided non-slip rug tape.  Do not have throw rugs and other things on the floor that can make you trip. What can I do in the bedroom?  Use night lights.  Make sure that you have a light by your bed that is easy to reach.  Do not use any sheets or blankets that are too big for your bed. They should not hang down onto the floor.  Have a firm chair that has side arms. You can use this for support while you get dressed.  Do not have throw rugs and other things on the floor that can make you trip. What can I do in the kitchen?  Clean up any spills right away.  Avoid walking on wet floors.  Keep items that you use a lot in easy-to-reach places.  If you need to reach something above  you, use a strong step stool that has a grab bar.  Keep electrical cords out of the way.  Do not use floor polish or wax that makes floors slippery. If you must use wax, use non-skid floor wax.  Do not have throw rugs and other things on the floor that can make you trip. What can I do with my stairs?  Do not leave any items on the stairs.  Make sure that there are handrails on both sides of the stairs and use them. Fix handrails that are broken or loose. Make sure that handrails are as long as the stairways.  Check any carpeting to make sure that it is firmly attached to the stairs. Fix any carpet that is loose or worn.  Avoid having throw rugs at the top or bottom of the stairs. If you do have throw rugs, attach them to the floor with carpet tape.  Make sure that you have a light switch at the top of the stairs and the bottom of the stairs. If you do not have them, ask someone to add them for  you. What else can I do to help prevent falls?  Wear shoes that:  Do not have high heels.  Have rubber bottoms.  Are comfortable and fit you well.  Are closed at the toe. Do not wear sandals.  If you use a stepladder:  Make sure that it is fully opened. Do not climb a closed stepladder.  Make sure that both sides of the stepladder are locked into place.  Ask someone to hold it for you, if possible.  Clearly mark and make sure that you can see:  Any grab bars or handrails.  First and last steps.  Where the edge of each step is.  Use tools that help you move around (mobility aids) if they are needed. These include:  Canes.  Walkers.  Scooters.  Crutches.  Turn on the lights when you go into a dark area. Replace any light bulbs as soon as they burn out.  Set up your furniture so you have a clear path. Avoid moving your furniture around.  If any of your floors are uneven, fix them.  If there are any pets around you, be aware of where they are.  Review your medicines with your doctor. Some medicines can make you feel dizzy. This can increase your chance of falling. Ask your doctor what other things that you can do to help prevent falls. This information is not intended to replace advice given to you by your health care provider. Make sure you discuss any questions you have with your health care provider. Document Released: 07/03/2009 Document Revised: 02/12/2016 Document Reviewed: 10/11/2014 Elsevier Interactive Patient Education  2017 [employer].

## 2021-02-05 ENCOUNTER — Encounter (INDEPENDENT_AMBULATORY_CARE_PROVIDER_SITE_OTHER): Payer: Self-pay | Admitting: *Deleted

## 2021-02-05 ENCOUNTER — Other Ambulatory Visit: Payer: Self-pay

## 2021-02-05 ENCOUNTER — Ambulatory Visit (INDEPENDENT_AMBULATORY_CARE_PROVIDER_SITE_OTHER): Payer: Medicare HMO | Admitting: Family Medicine

## 2021-02-05 ENCOUNTER — Encounter: Payer: Self-pay | Admitting: Family Medicine

## 2021-02-05 VITALS — BP 133/77 | HR 77 | Resp 16 | Ht 66.0 in | Wt 200.0 lb

## 2021-02-05 DIAGNOSIS — F17210 Nicotine dependence, cigarettes, uncomplicated: Secondary | ICD-10-CM

## 2021-02-05 DIAGNOSIS — E782 Mixed hyperlipidemia: Secondary | ICD-10-CM | POA: Diagnosis not present

## 2021-02-05 DIAGNOSIS — R7302 Impaired glucose tolerance (oral): Secondary | ICD-10-CM | POA: Diagnosis not present

## 2021-02-05 DIAGNOSIS — D126 Benign neoplasm of colon, unspecified: Secondary | ICD-10-CM

## 2021-02-05 DIAGNOSIS — F321 Major depressive disorder, single episode, moderate: Secondary | ICD-10-CM

## 2021-02-05 DIAGNOSIS — E559 Vitamin D deficiency, unspecified: Secondary | ICD-10-CM | POA: Diagnosis not present

## 2021-02-05 DIAGNOSIS — E669 Obesity, unspecified: Secondary | ICD-10-CM | POA: Diagnosis not present

## 2021-02-05 DIAGNOSIS — Z0000 Encounter for general adult medical examination without abnormal findings: Secondary | ICD-10-CM

## 2021-02-05 DIAGNOSIS — F17219 Nicotine dependence, cigarettes, with unspecified nicotine-induced disorders: Secondary | ICD-10-CM

## 2021-02-05 MED ORDER — BUPROPION HCL ER (XL) 150 MG PO TB24: 150.0000 mg | ORAL_TABLET | Freq: Every day | ORAL | 3 refills | Status: DC

## 2021-02-05 NOTE — Assessment & Plan Note (Signed)
Current smoker motivated to quit. Needs lung cancer screening due to years of use Asked:confirms currently smokes cigarettes Assess: Unwilling to set a quit date, but is cutting back Advise: needs to QUIT to reduce risk of cancer, cardio and cerebrovascular disease Assist: counseled for 5 minutes and literature provided Arrange: follow up in 2 to 4 months 'Start Wellbutrin, encouraged to set  A quit date for the next 2 weeks

## 2021-02-05 NOTE — Assessment & Plan Note (Signed)
Start wellbutrin, re eval in 6 to 8 weeks

## 2021-02-05 NOTE — Assessment & Plan Note (Signed)

## 2021-02-05 NOTE — Progress Notes (Signed)
    Amanda Simon     MRN: [id number]      DOB: 1950/12/11  HPI: Patient is in for annual physical exam. C/o depression and anxiety due to ill health of her spouse who is battling lung cancer along with many other chronic illnesses Needs colonoscopy, past due, and mammogram is up to date Smoking has increased, wants to quit but unwilling to set date now due to increased stress  PE: BP 133/77   Pulse 77   Resp 16   Ht 5\' 6"  (1.676 m)   Wt 200 lb (90.7 kg)   SpO2 94%   BMI 32.28 kg/m   Pleasant  female, alert and oriented x 3, in no cardio-pulmonary distress. Afebrile. HEENT No facial trauma or asymetry. Sinuses non tender.  Extra occullar muscles intact.. External ears normal, . Neck: supple, no adenopathy,JVD or thyromegaly.No bruits.  Chest: Clear to ascultation bilaterally.No crackles or wheezes. Non tender to palpation  Breast: Asymptomatic, not examined, mammogram normal in 2021  Cardiovascular system; Heart sounds normal,  S1 and  S2 ,no S3.  No murmur, or thrill. .  Abdomen: Soft, non tender, .   GU:  Asymptomatic, not examined  Musculoskeletal exam: Full ROM of spine, hips , shoulders and knees. No deformity ,swelling or crepitus noted. No muscle wasting or atrophy.   Neurologic: Cranial nerves 2 to 12 intact. Power, tone ,sensation and reflexes normal throughout. No disturbance in gait. No tremor.  Skin: Intact, no ulceration, erythema , scaling or rash noted. Pigmentation normal throughout  Psych; Mildly depressed and anxious. Judgement and concentration normal   Assessment & Plan:  Annual physical exam Annual exam as documented. Counseling done  re healthy lifestyle involving commitment to 150 minutes exercise per week, heart healthy diet, and attaining healthy weight.The importance of adequate sleep also discussed. Regular seat belt use and home safety, is also discussed. Changes in health habits are decided on by the patient with goals  and time frames  set for achieving them. Immunization and cancer screening needs are specifically addressed at this visit.   Cigarette nicotine dependence with nicotine-induced disorder Current smoker motivated to quit. Needs lung cancer screening due to years of use Asked:confirms currently smokes cigarettes Assess: Unwilling to set a quit date, but is cutting back Advise: needs to QUIT to reduce risk of cancer, cardio and cerebrovascular disease Assist: counseled for 5 minutes and literature provided Arrange: follow up in 2 to 4 months 'Start Wellbutrin, encouraged to set  A quit date for the next 2 weeks  Tubular adenoma of colon Screening colonoscopy past due , refer for same , pt re educated re the importance  Of follow through  Obesity (BMI 30.0-34.9)  Patient re-educated about  the importance of commitment to a  minimum of 150 minutes of exercise per week as able.  The importance of healthy food choices with portion control discussed, as well as eating regularly and within a 12 hour window most days. The need to choose "clean , green" food 50 to 75% of the time is discussed, as well as to make water the primary drink and set a goal of 64 ounces water daily.    Weight /BMI 02/05/2021 01/28/2021 06/18/2020  WEIGHT 200 lb - 203 lb  HEIGHT 5\' 6"  - 5\' 6"   BMI 32.28 kg/m2 - 32.77 kg/m2      Depression, major, single episode, moderate (HCC) Start wellbutrin, re eval in 6 to 8 weeks

## 2021-02-05 NOTE — Patient Instructions (Addendum)
F/U end August, flu vaccine and re evaluate depression  Fasting lipid, cmp and EGfr, and hBa1C  And vit D today   You are being referred for screening chest scan due to over 20 pack year history Work on quitting smoking  New for depression, anxiety  and to help with quitting smoking is wellbutr in one daily  Stop aspirin  You are referred for colonoscopy this is past due, and you had polyps    Steps to Quit Smoking Smoking tobacco is the leading cause of preventable death. It can affect almost every organ in the body. Smoking puts you and people around you at risk for many serious, long-lasting (chronic) diseases. Quitting smoking can be hard, but it is one of the best things that you can do for your health. It is never too late to quit. How do I get ready to quit? When you decide to quit smoking, make a plan to help you succeed. Before you quit:  Pick a date to quit. Set a date within the next 2 weeks to give you time to prepare.  Write down the reasons why you are quitting. Keep this list in places where you will see it often.  Tell your family, friends, and co-workers that you are quitting. Their support is important.  Talk with your doctor about the choices that may help you quit.  Find out if your health insurance will pay for these treatments.  Know the people, places, things, and activities that make you want to smoke (triggers). Avoid them. What first steps can I take to quit smoking?  Throw away all cigarettes at home, at work, and in your car.  Throw away the things that you use when you smoke, such as ashtrays and lighters.  Clean your car. Make sure to empty the ashtray.  Clean your home, including curtains and carpets. What can I do to help me quit smoking? Talk with your doctor about taking medicines and seeing a counselor at the same time. You are more likely to succeed when you do both.  If you are pregnant or breastfeeding, talk with your doctor about  counseling or other ways to quit smoking. Do not take medicine to help you quit smoking unless your doctor tells you to do so. To quit smoking: Quit right away  Quit smoking totally, instead of slowly cutting back on how much you smoke over a period of time.  Go to counseling. You are more likely to quit if you go to counseling sessions regularly. Take medicine You may take medicines to help you quit. Some medicines need a prescription, and some you can buy over-the-counter. Some medicines may contain a drug called nicotine to replace the nicotine in cigarettes. Medicines may:  Help you to stop having the desire to smoke (cravings).  Help to stop the problems that come when you stop smoking (withdrawal symptoms). Your doctor may ask you to use:  Nicotine patches, gum, or lozenges.  Nicotine inhalers or sprays.  Non-nicotine medicine that is taken by mouth. Find resources Find resources and other ways to help you quit smoking and remain smoke-free after you quit. These resources are most helpful when you use them often. They include:  Online chats with a Social worker.  Phone quitlines.  Printed Furniture conservator/restorer.  Support groups or group counseling.  Text messaging programs.  Mobile phone apps. Use apps on your mobile phone or tablet that can help you stick to your quit plan. There are many free apps  for mobile phones and tablets as well as websites. Examples include Quit Guide from the [employer] and smokefree.gov   What things can I do to make it easier to quit?  Talk to your family and friends. Ask them to support and encourage you.  Call a phone quitline (1-800-QUIT-NOW), reach out to support groups, or work with a Social worker.  Ask people who smoke to not smoke around you.  Avoid places that make you want to smoke, such as: ? Bars. ? Parties. ? Smoke-break areas at work.  Spend time with people who do not smoke.  Lower the stress in your life. Stress can make you want to  smoke. Try these things to help your stress: ? Getting regular exercise. ? Doing deep-breathing exercises. ? Doing yoga. ? Meditating. ? Doing a body scan. To do this, close your eyes, focus on one area of your body at a time from head to toe. Notice which parts of your body are tense. Try to relax the muscles in those areas.   How will I feel when I quit smoking? Day 1 to 3 weeks Within the first 24 hours, you may start to have some problems that come from quitting tobacco. These problems are very bad 2-3 days after you quit, but they do not often last for more than 2-3 weeks. You may get these symptoms:  Mood swings.  Feeling restless, nervous, angry, or annoyed.  Trouble concentrating.  Dizziness.  Strong desire for high-sugar foods and nicotine.  Weight gain.  Trouble pooping (constipation).  Feeling like you may vomit (nausea).  Coughing or a sore throat.  Changes in how the medicines that you take for other issues work in your body.  Depression.  Trouble sleeping (insomnia). Week 3 and afterward After the first 2-3 weeks of quitting, you may start to notice more positive results, such as:  Better sense of smell and taste.  Less coughing and sore throat.  Slower heart rate.  Lower blood pressure.  Clearer skin.  Better breathing.  Fewer sick days. Quitting smoking can be hard. Do not give up if you fail the first time. Some people need to try a few times before they succeed. Do your best to stick to your quit plan, and talk with your doctor if you have any questions or concerns. Summary  Smoking tobacco is the leading cause of preventable death. Quitting smoking can be hard, but it is one of the best things that you can do for your health.  When you decide to quit smoking, make a plan to help you succeed.  Quit smoking right away, not slowly over a period of time.  When you start quitting, seek help from your doctor, family, or friends. This information  is not intended to replace advice given to you by your health care provider. Make sure you discuss any questions you have with your health care provider. Document Revised: 06/01/2019 Document Reviewed: 11/25/2018 Elsevier Patient Education  [place].

## 2021-02-05 NOTE — Assessment & Plan Note (Signed)
  Patient re-educated about  the importance of commitment to a  minimum of 150 minutes of exercise per week as able.  The importance of healthy food choices with portion control discussed, as well as eating regularly and within a 12 hour window most days. The need to choose "clean , green" food 50 to 75% of the time is discussed, as well as to make water the primary drink and set a goal of 64 ounces water daily.    Weight /BMI 02/05/2021 01/28/2021 06/18/2020  WEIGHT 200 lb - 203 lb  HEIGHT 5\' 6"  - 5\' 6"   BMI 32.28 kg/m2 - 32.77 kg/m2

## 2021-02-05 NOTE — Assessment & Plan Note (Signed)
Screening colonoscopy past due , refer for same , pt re educated re the importance  Of follow through

## 2021-02-06 LAB — CMP14+EGFR
ALT: 15 IU/L (ref 0–32)
AST: 18 IU/L (ref 0–40)
Albumin/Globulin Ratio: 1.6 (ref 1.2–2.2)
Albumin: 4.5 g/dL (ref 3.7–4.7)
Alkaline Phosphatase: 114 IU/L (ref 44–121)
BUN/Creatinine Ratio: 19 (ref 12–28)
BUN: 13 mg/dL (ref 8–27)
Bilirubin Total: 0.4 mg/dL (ref 0.0–1.2)
CO2: 26 mmol/L (ref 20–29)
Calcium: 9.6 mg/dL (ref 8.7–10.3)
Chloride: 102 mmol/L (ref 96–106)
Creatinine, Ser: 0.68 mg/dL (ref 0.57–1.00)
Globulin, Total: 2.9 g/dL (ref 1.5–4.5)
Glucose: 100 mg/dL — ABNORMAL HIGH (ref 65–99)
Potassium: 4.6 mmol/L (ref 3.5–5.2)
Sodium: 140 mmol/L (ref 134–144)
Total Protein: 7.4 g/dL (ref 6.0–8.5)
eGFR: 93 mL/min/{1.73_m2} (ref >59)

## 2021-02-06 LAB — LIPID PANEL
Chol/HDL Ratio: 3.3 ratio (ref 0.0–4.4)
Cholesterol, Total: 189 mg/dL (ref 100–199)
HDL: 57 mg/dL (ref >39)
LDL Chol Calc (NIH): 118 mg/dL — ABNORMAL HIGH (ref 0–99)
Triglycerides: 78 mg/dL (ref 0–149)
VLDL Cholesterol Cal: 14 mg/dL (ref 5–40)

## 2021-02-06 LAB — HEMOGLOBIN A1C
Est. average glucose Bld gHb Est-mCnc: 131 mg/dL
Hgb A1c MFr Bld: 6.2 % — ABNORMAL HIGH (ref 4.8–5.6)

## 2021-02-06 LAB — VITAMIN D 25 HYDROXY (VIT D DEFICIENCY, FRACTURES): Vit D, 25-Hydroxy: 21.1 ng/mL — ABNORMAL LOW (ref 30.0–100.0)

## 2021-03-06 ENCOUNTER — Ambulatory Visit (HOSPITAL_COMMUNITY): Payer: Medicare HMO

## 2021-03-27 ENCOUNTER — Ambulatory Visit (HOSPITAL_COMMUNITY): Payer: Medicare HMO

## 2021-04-13 ENCOUNTER — Other Ambulatory Visit: Payer: Self-pay | Admitting: Family Medicine

## 2021-04-22 ENCOUNTER — Ambulatory Visit (HOSPITAL_COMMUNITY): Payer: Medicare HMO

## 2021-05-20 ENCOUNTER — Ambulatory Visit: Payer: Medicare HMO | Admitting: Family Medicine

## 2021-06-24 ENCOUNTER — Other Ambulatory Visit (HOSPITAL_COMMUNITY): Payer: Self-pay | Admitting: Family Medicine

## 2021-06-24 ENCOUNTER — Ambulatory Visit (INDEPENDENT_AMBULATORY_CARE_PROVIDER_SITE_OTHER): Payer: Medicare HMO | Admitting: Family Medicine

## 2021-06-24 ENCOUNTER — Encounter: Payer: Self-pay | Admitting: Family Medicine

## 2021-06-24 ENCOUNTER — Other Ambulatory Visit: Payer: Self-pay

## 2021-06-24 ENCOUNTER — Encounter (INDEPENDENT_AMBULATORY_CARE_PROVIDER_SITE_OTHER): Payer: Self-pay

## 2021-06-24 VITALS — BP 138/77 | HR 80 | Resp 16 | Ht 66.0 in | Wt 198.1 lb

## 2021-06-24 DIAGNOSIS — F17219 Nicotine dependence, cigarettes, with unspecified nicotine-induced disorders: Secondary | ICD-10-CM | POA: Diagnosis not present

## 2021-06-24 DIAGNOSIS — Z1231 Encounter for screening mammogram for malignant neoplasm of breast: Secondary | ICD-10-CM

## 2021-06-24 DIAGNOSIS — E782 Mixed hyperlipidemia: Secondary | ICD-10-CM

## 2021-06-24 DIAGNOSIS — R7302 Impaired glucose tolerance (oral): Secondary | ICD-10-CM | POA: Diagnosis not present

## 2021-06-24 DIAGNOSIS — Z23 Encounter for immunization: Secondary | ICD-10-CM

## 2021-06-24 DIAGNOSIS — I10 Essential (primary) hypertension: Secondary | ICD-10-CM | POA: Diagnosis not present

## 2021-06-24 DIAGNOSIS — D126 Benign neoplasm of colon, unspecified: Secondary | ICD-10-CM

## 2021-06-24 DIAGNOSIS — Z6379 Other stressful life events affecting family and household: Secondary | ICD-10-CM | POA: Diagnosis not present

## 2021-06-24 DIAGNOSIS — E669 Obesity, unspecified: Secondary | ICD-10-CM

## 2021-06-24 MED ORDER — BUSPIRONE HCL 5 MG PO TABS: 5.0000 mg | ORAL_TABLET | Freq: Two times a day (BID) | ORAL | 3 refills | Status: DC

## 2021-06-24 NOTE — Progress Notes (Signed)
   Amanda Simon     MRN: [id number]      DOB: 18-Sep-1951   HPI Amanda Simon is here for follow up and re-evaluation of chronic medical conditions, medication management and review of any available recent lab and radiology data.  Preventive health is updated, specifically  Cancer screening and Immunization.   .Increased anxiety due to ill health of husband, smokes 3 to 4 ciggs / day, quit date set for 09/13/2021 The PT denies any adverse reactions to current medications since the last visit.  ROS Denies recent fever or chills. Denies sinus pressure, nasal congestion, ear pain or sore throat. Denies chest congestion, productive cough or wheezing. Denies chest pains, palpitations and leg swelling Denies abdominal pain, nausea, vomiting,diarrhea or constipation.   Denies dysuria, frequency, hesitancy or incontinence. Denies joint pain, swelling and limitation in mobility. Denies headaches, seizures, numbness, or tingling. . Denies skin break down or rash.   PE  BP 138/77   Pulse 80   Resp 16   Ht 5\' 6"  (1.676 m)   Wt 198 lb 1.9 oz (89.9 kg)   SpO2 95%   BMI 31.98 kg/m   Patient alert and oriented and in no cardiopulmonary distress.  HEENT: No facial asymmetry, EOMI,     Neck supple .  Chest: Clear to auscultation bilaterally.  CVS: S1, S2 no murmurs, no S3.Regular rate.  ABD: Soft non tender.   Ext: No edema  MS: Adequate ROM spine, shoulders, hips and knees.  Skin: Intact, no ulcerations or rash noted.  Psych: Good eye contact, normal affect. Memory intact  anxious not depressed appearing.  CNS: CN 2-12 intact, power,  normal throughout.no focal deficits noted.   Assessment & Plan  Cigarette nicotine dependence with nicotine-induced disorder  Asked:confirms currently smokes cigarettes, 3 to 4 Assess: willing to set a quit date, and  is cutting back Advise: needs to QUIT to reduce risk of cancer, cardio and cerebrovascular disease Assist: counseled for 5  minutes and literature provided Arrange: follow up in 2 to 4 months   Essential hypertension Controlled, no change in medication DASH diet and commitment to daily physical activity for a minimum of 30 minutes discussed and encouraged, as a part of hypertension management. The importance of attaining a healthy weight is also discussed.  BP/Weight 06/24/2021 02/05/2021 01/28/2021 06/18/2020 01/14/2020 07/09/2019 76/03/2093  Systolic BP 709 628 - 366 294 765 465  Diastolic BP 77 77 - 82 80 80 78  Wt. (Lbs) 198.12 200 - 203 206 206 206  BMI 31.98 32.28 - 32.77 33.25 33.25 33.25       Obesity (BMI 30.0-34.9) Improved. Pt applauded on succesful weight loss through lifestyle change, and encouraged to continue same. Weight loss goal set for the next several months.   Hyperlipemia Hyperlipidemia:Low fat diet discussed and encouraged.   Lipid Panel  Lab Results  Component Value Date   CHOL 189 02/05/2021   HDL 57 02/05/2021   LDLCALC 118 (H) 02/05/2021   TRIG 78 02/05/2021   CHOLHDL 3.3 02/05/2021     Updated lab needed  Tubular adenoma of colon Colonoscopy past due referral to be completed today and appointment made  Stress due to illness of family member Elevated gad SCORE, START [name]

## 2021-06-24 NOTE — Assessment & Plan Note (Signed)
  Asked:confirms currently smokes cigarettes, 3 to 4 Assess: willing to set a quit date, and  is cutting back Advise: needs to QUIT to reduce risk of cancer, cardio and cerebrovascular disease Assist: counseled for 5 minutes and literature provided Arrange: follow up in 2 to 4 months

## 2021-06-24 NOTE — Assessment & Plan Note (Signed)
Elevated gad SCORE, START [place]PAR TWICE DAILY

## 2021-06-24 NOTE — Patient Instructions (Addendum)
Annual exam May 12 or after  Follow-up in office with MD early January to reevaluate blood pressure and anxiety.  Also to reevaluate smoking.  Happy birthday [birth year] enjoy.  Quit date for smoking is September 13, 2021 or sooner all the best with this.  Please schedule mammogram and chest scan for lung cancer screening at checkout.  New for anxiety is buspirone 5 mg twice daily  Please [facility] hedule appt for colonoscopy at checkout   Flu vaccine today.  Labs today lipid CMP and EGFR HbA1c.CBC and TSH  Continue to use all the coping strategies you at you are to live happily with all the stresses of life that you have on you and I wish you all the best.  Congrats on weight loss  Thanks for choosing [facility], we consider it a privelige to serve you.

## 2021-06-24 NOTE — Assessment & Plan Note (Signed)
Colonoscopy past due referral to be completed today and appointment made

## 2021-06-24 NOTE — Assessment & Plan Note (Signed)
Controlled, no change in medication DASH diet and commitment to daily physical activity for a minimum of 30 minutes discussed and encouraged, as a part of hypertension management. The importance of attaining a healthy weight is also discussed.  BP/Weight 06/24/2021 02/05/2021 01/28/2021 06/18/2020 01/14/2020 07/09/2019 90/05/3111  Systolic BP 162 446 - 950 722 575 051  Diastolic BP 77 77 - 82 80 80 78  Wt. (Lbs) 198.12 200 - 203 206 206 206  BMI 31.98 32.28 - 32.77 33.25 33.25 33.25

## 2021-06-24 NOTE — Assessment & Plan Note (Signed)
Improved. Pt applauded on succesful weight loss through lifestyle change, and encouraged to continue same. Weight loss goal set for the next several months.  

## 2021-06-24 NOTE — Assessment & Plan Note (Signed)
Hyperlipidemia:Low fat diet discussed and encouraged.   Lipid Panel  Lab Results  Component Value Date   CHOL 189 02/05/2021   HDL 57 02/05/2021   LDLCALC 118 (H) 02/05/2021   TRIG 78 02/05/2021   CHOLHDL 3.3 02/05/2021     Updated lab needed

## 2021-06-25 LAB — LIPID PANEL
Chol/HDL Ratio: 3.1 ratio (ref 0.0–4.4)
Cholesterol, Total: 185 mg/dL (ref 100–199)
HDL: 59 mg/dL (ref >39)
LDL Chol Calc (NIH): 113 mg/dL — ABNORMAL HIGH (ref 0–99)
Triglycerides: 70 mg/dL (ref 0–149)
VLDL Cholesterol Cal: 13 mg/dL (ref 5–40)

## 2021-06-25 LAB — CBC
Hematocrit: 39.4 % (ref 34.0–46.6)
Hemoglobin: 13.2 g/dL (ref 11.1–15.9)
MCH: 28.9 pg (ref 26.6–33.0)
MCHC: 33.5 g/dL (ref 31.5–35.7)
MCV: 86 fL (ref 79–97)
Platelets: 304 10*3/uL (ref 150–450)
RBC: 4.57 x10E6/uL (ref 3.77–5.28)
RDW: 11.8 % (ref 11.7–15.4)
WBC: 8.8 10*3/uL (ref 3.4–10.8)

## 2021-06-25 LAB — TSH: TSH: 1.1 u[IU]/mL (ref 0.450–4.500)

## 2021-06-25 LAB — CMP14+EGFR
ALT: 14 IU/L (ref 0–32)
AST: 17 IU/L (ref 0–40)
Albumin/Globulin Ratio: 1.5 (ref 1.2–2.2)
Albumin: 4.3 g/dL (ref 3.7–4.7)
Alkaline Phosphatase: 105 IU/L (ref 44–121)
BUN/Creatinine Ratio: 21 (ref 12–28)
BUN: 13 mg/dL (ref 8–27)
Bilirubin Total: 0.5 mg/dL (ref 0.0–1.2)
CO2: 24 mmol/L (ref 20–29)
Calcium: 9.3 mg/dL (ref 8.7–10.3)
Chloride: 102 mmol/L (ref 96–106)
Creatinine, Ser: 0.62 mg/dL (ref 0.57–1.00)
Globulin, Total: 2.9 g/dL (ref 1.5–4.5)
Glucose: 100 mg/dL — ABNORMAL HIGH (ref 70–99)
Potassium: 4.1 mmol/L (ref 3.5–5.2)
Sodium: 142 mmol/L (ref 134–144)
Total Protein: 7.2 g/dL (ref 6.0–8.5)
eGFR: 95 mL/min/{1.73_m2} (ref >59)

## 2021-06-25 LAB — HEMOGLOBIN A1C
Est. average glucose Bld gHb Est-mCnc: 134 mg/dL
Hgb A1c MFr Bld: 6.3 % — ABNORMAL HIGH (ref 4.8–5.6)

## 2021-07-07 ENCOUNTER — Other Ambulatory Visit: Payer: Self-pay | Admitting: Family Medicine

## 2021-07-28 ENCOUNTER — Other Ambulatory Visit: Payer: Self-pay | Admitting: Family Medicine

## 2021-08-21 ENCOUNTER — Ambulatory Visit (HOSPITAL_COMMUNITY): Payer: Medicare HMO

## 2021-09-22 ENCOUNTER — Ambulatory Visit: Payer: Medicare HMO | Admitting: Family Medicine

## 2021-10-21 ENCOUNTER — Telehealth: Payer: Self-pay

## 2021-10-21 NOTE — Telephone Encounter (Signed)
Patient called to let  Dr Moshe Cipro know her husband passed away and will see her in May for her physical.

## 2021-10-21 NOTE — Telephone Encounter (Signed)
Fyi.

## 2021-11-02 ENCOUNTER — Telehealth (INDEPENDENT_AMBULATORY_CARE_PROVIDER_SITE_OTHER): Payer: Self-pay | Admitting: *Deleted

## 2021-11-02 ENCOUNTER — Encounter (INDEPENDENT_AMBULATORY_CARE_PROVIDER_SITE_OTHER): Payer: Self-pay | Admitting: *Deleted

## 2021-11-02 NOTE — Telephone Encounter (Signed)
Left several message for patient to call to schedule TCS  (10/16/21 at 1105 am, 10/21/21 at 1052 am & 10/27/21 at 121 pm)  [facility] Room 1  Referring MD/PCP: simpson  Procedure: tcs  Reason/Indication:  hx polyps  Has patient had this procedure before?  Yes, 02/2015  If so, when, by whom and where?    Is there a family history of colon cancer?  no  Who?  What age when diagnosed?    Is patient diabetic? If yes, Type 1 or Type 2   no      Does patient have prosthetic heart valve or mechanical valve?  no  Do you have a pacemaker/defibrillator?  no  Has patient ever had endocarditis/atrial fibrillation? no  Does patient use oxygen? no  Has patient had joint replacement within last 12 months?  no  Is patient constipated or do they take laxatives? no  Does patient have a history of alcohol/drug use?  no  Have you had a stroke/heart attack last 6 mths? no  Do you take medicine for weight loss?  no  For female patients,: have you had a hysterectomy                       are you post menopausal                       do you still have your menstrual cycle no  Is patient on blood thinner such as Coumadin, Plavix and/or Aspirin? no  Medications: simvastatin 40 mg daily, losartan 100/25 mg daily  Allergies: nkda  Pharmacy: [facility] [address] [place]  Medication Adjustment per Dr Rehman/Dr Jenetta Downer   Procedure date & time:

## 2021-12-05 ENCOUNTER — Other Ambulatory Visit: Payer: Self-pay

## 2021-12-05 ENCOUNTER — Ambulatory Visit
Admission: EM | Admit: 2021-12-05 | Discharge: 2021-12-05 | Disposition: A | Discharge status: Home / Self Care | Payer: Medicare HMO | Attending: Physician Assistant | Admitting: Physician Assistant

## 2021-12-05 DIAGNOSIS — R002 Palpitations: Secondary | ICD-10-CM

## 2021-12-05 DIAGNOSIS — F411 Generalized anxiety disorder: Secondary | ICD-10-CM

## 2021-12-05 MED ORDER — HYDROXYZINE HCL 25 MG PO TABS: 25.0000 mg | ORAL_TABLET | Freq: Four times a day (QID) | ORAL | 0 refills | Status: DC

## 2021-12-05 NOTE — ED Provider Notes (Signed)
?[facility] ? ? ? ?CSN: [id number] ?Arrival date & time: 12/05/21  0820 ? ? ?  ? ?History   ?Chief Complaint ?Chief Complaint  ?Patient presents with  ? Dizziness  ? Palpitations  ? ? ?HPI ?Amanda Simon is a 71 y.o. female.  ? ?Patient here today for evaluation of palpitations and a "jittery" feeling in her abdomen with decreased appetite that has been ongoing for the last 4 days.  She reports that she has not had any chest pain but does have some shortness of breath at times.  She notes that her husband passed away about a month ago and they are dealing with a significant amount of paperwork regarding insurance, etc.  She denies any thoughts of wanting to harm herself. She does report she is having difficulty sleeping.  ? ?The history is provided by the patient.  ?Dizziness ?Associated symptoms: nausea, palpitations and shortness of breath   ?Associated symptoms: no chest pain and no vomiting   ?Palpitations ?Associated symptoms: nausea and shortness of breath   ?Associated symptoms: no chest pain and no vomiting   ? ?Past Medical History:  ?Diagnosis Date  ? Depression   ? Depression, major, single episode, moderate ([place]) 08/27/2018  ? PHQ 9 score of 14 in 08/2018, not suicidal or homicidal, medication started and referred to telethewrapy  ? Hyperlipidemia   ? Hypertension   ? IGT (impaired glucose tolerance) 2014  ? Obesity   ? Osteopenia   ? ? ?Patient Active Problem List  ? Diagnosis Date Noted  ? Stress due to illness of family member 06/24/2021  ? Vitamin D deficiency 08/06/2016  ? Tubular adenoma of colon 07/22/2015  ? Cigarette nicotine dependence with nicotine-induced disorder 03/28/2012  ? IGT (impaired glucose tolerance) 12/05/2011  ? Hyperlipemia 01/02/2008  ? Obesity (BMI 30.0-34.9) 01/02/2008  ? Essential hypertension 01/02/2008  ? OSTEOPENIA 01/02/2008  ? ? ?Past Surgical History:  ?Procedure Laterality Date  ? ABDOMINAL HYSTERECTOMY  1975  ? fibroids  ? COLONOSCOPY N/A 02/20/2015  ?  Procedure: COLONOSCOPY;  Surgeon: Rogene Houston, MD;  [facility];  Service: Endoscopy;  Laterality: N/A;  930  ? PARTIAL HYSTERECTOMY    ? TUBAL LIGATION    ? ? ?OB History   ?No obstetric history on file. ?  ? ? ? ?Home Medications   ? ?Prior to Admission medications   ?Medication Sig Start Date End Date Taking? Authorizing Provider  ?hydrOXYzine (ATARAX) 25 MG tablet Take 1 tablet (25 mg total) by mouth every 6 (six) hours. 12/05/21  Yes Francene Finders, PA-C  ?buPROPion (WELLBUTRIN XL) 150 MG 24 hr tablet Take 1 tablet (150 mg total) by mouth daily. ?Patient not taking: Reported on 06/24/2021 02/05/21   Fayrene Helper, MD  ?busPIRone (BUSPAR) 5 MG tablet TAKE 1 TABLET BY MOUTH TWICE A DAY 07/28/21   Fayrene Helper, MD  ?cholecalciferol (VITAMIN D3) 25 MCG (1000 UNIT) tablet Take 1,000 Units by mouth daily. Takes 5000 units daily    [provider]  ?losartan-hydrochlorothiazide (HYZAAR) 100-25 MG tablet TAKE 1 TABLET BY MOUTH EVERY DAY 04/13/21   Fayrene Helper, MD  ?simvastatin (ZOCOR) 40 MG tablet TAKE 1 TABLET BY MOUTH EVERYDAY AT BEDTIME 07/07/21   Fayrene Helper, MD  ? ? ?Family History ?Family History  ?Problem Relation Age of Onset  ? Lung cancer Mother   ? Aneurysm Father   ?     brain   ? Hypertension Sister   ?  Hypertension Sister   ? ? ?Social History ?Social History  ? ?Tobacco Use  ? Smoking status: Some Days  ?  Packs/day: 0.25  ?  Years: 35.00  ?  Pack years: 8.75  ?  Types: Cigarettes  ?  Last attempt to quit: 03/01/2017  ?  Years since quitting: 4.7  ? Smokeless tobacco: Never  ? Tobacco comments:  ?  2 per day   ?Substance Use Topics  ? Alcohol use: No  ?  Alcohol/week: 0.0 standard drinks  ? Drug use: No  ? ? ? ?Allergies   ?Metformin and related ? ? ?Review of Systems ?Review of Systems  ?Constitutional:  Negative for chills and fever.  ?Eyes:  Negative for discharge and redness.  ?Respiratory:  Positive for shortness of breath.   ?Cardiovascular:   Positive for palpitations. Negative for chest pain.  ?Gastrointestinal:  Positive for nausea. Negative for vomiting.  ? ? ?Physical Exam ?Triage Vital Signs ?ED Triage Vitals  ?Enc Vitals Group  ?   BP 12/05/21 0836 (!) 153/88  ?   Pulse Rate 12/05/21 0836 80  ?   Resp 12/05/21 0836 18  ?   Temp 12/05/21 0836 98.5 ?F (36.9 ?C)  ?   Temp Source 12/05/21 0836 Oral  ?   SpO2 12/05/21 0836 96 %  ?   Weight --   ?   Height --   ?   Head Circumference --   ?   Peak Flow --   ?   Pain Score 12/05/21 0835 8  ?   Pain Loc --   ?   Pain Edu? --   ?   Excl. in [place]? --   ? ?No data found. ? ?Updated Vital Signs ?BP (!) 153/88 (BP Location: Right Arm)   Pulse 80   Temp 98.5 ?F (36.9 ?C) (Oral)   Resp 18   SpO2 96%  ? ? ?Physical Exam ?Vitals and nursing note reviewed.  ?Constitutional:   ?   General: She is not in acute distress. ?   Appearance: Normal appearance. She is not ill-appearing.  ?HENT:  ?   Head: Normocephalic and atraumatic.  ?   Nose: No congestion or rhinorrhea.  ?Eyes:  ?   Conjunctiva/sclera: Conjunctivae normal.  ?Cardiovascular:  ?   Rate and Rhythm: Normal rate and regular rhythm.  ?   Heart sounds: Normal heart sounds. No murmur heard. ?Pulmonary:  ?   Effort: Pulmonary effort is normal. No respiratory distress.  ?   Breath sounds: Normal breath sounds. No wheezing, rhonchi or rales.  ?Skin: ?   General: Skin is warm and dry.  ?Neurological:  ?   Mental Status: She is alert.  ?Psychiatric:     ?   Mood and Affect: Mood normal.     ?   Thought Content: Thought content normal.  ? ? ? ?UC Treatments / Results  ?Labs ?(all labs ordered are listed, but only abnormal results are displayed) ?Labs Reviewed - No data to display ? ?EKG ? ? ?Radiology ?No results found. ? ?Procedures ?Procedures (including critical care time) ? ?Medications Ordered in UC ?Medications - No data to display ? ?Initial Impression / Assessment and Plan / UC Course  ?I have reviewed the triage vital signs and the nursing  notes. ? ?Pertinent labs & imaging results that were available during my care of the patient were reviewed by me and considered in my medical decision making (see chart for details). ? ?  ?Vitals  and EKG are within normal limits.  I suspect that palpitations and other symptoms are likely related to anxiety given current situation after her husband's passing.  I gave her contact information for behavioral health urgent care and will prescribe hydroxyzine to hopefully help with some insomnia.  Recommended close follow-up with her PCP as she may need medication adjustment during this time.  I encouraged her to report to the emergency room with any worsening symptoms in the meantime. Patient expresses understanding ? ?Final Clinical Impressions(s) / UC Diagnoses  ? ?Final diagnoses:  ?Palpitations  ?Anxiety state  ? ? ? ?Discharge Instructions   ? ?  ? ?[facility] ? ?[place], [place] ? ?[phone] ? ? ? ? ?ED Prescriptions   ? ? Medication Sig Dispense Auth. Provider  ? hydrOXYzine (ATARAX) 25 MG tablet Take 1 tablet (25 mg total) by mouth every 6 (six) hours. 12 tablet Francene Finders, PA-C  ? ?  ? ?PDMP not reviewed this encounter. ?  ?Francene Finders, PA-C ?12/05/21 7622 ? ?

## 2021-12-05 NOTE — ED Triage Notes (Signed)
Pt states she has been feeling heart palpitations and jittery in her abd for a few days, and has had a lack of appetite. She reports her husband died a month ago. Pt states she has SOB at times, she denies chest pain or numbness/ tingling. ?Started: Wednesday this week ?

## 2021-12-05 NOTE — ED Notes (Signed)
EKG results given to provider.  

## 2021-12-05 NOTE — Discharge Instructions (Signed)
?  [facility] ? ?[place], [place] ? ?[phone] ? ? ?

## 2021-12-24 ENCOUNTER — Ambulatory Visit (INDEPENDENT_AMBULATORY_CARE_PROVIDER_SITE_OTHER): Payer: Medicare HMO | Admitting: Family Medicine

## 2021-12-24 ENCOUNTER — Encounter: Payer: Self-pay | Admitting: Family Medicine

## 2021-12-24 DIAGNOSIS — F411 Generalized anxiety disorder: Secondary | ICD-10-CM | POA: Diagnosis not present

## 2021-12-24 MED ORDER — HYDROXYZINE PAMOATE 25 MG PO CAPS: ORAL_CAPSULE | ORAL | 2 refills | Status: DC

## 2021-12-24 MED ORDER — LOSARTAN POTASSIUM-HCTZ 100-25 MG PO TABS
1.0000 | ORAL_TABLET | Freq: Every day | ORAL | 3 refills | Status: DC
Start: 2021-12-24 — End: 2022-12-27

## 2021-12-24 MED ORDER — BUSPIRONE HCL 5 MG PO TABS: 5.0000 mg | ORAL_TABLET | Freq: Three times a day (TID) | ORAL | 2 refills | Status: DC

## 2021-12-24 MED ORDER — SIMVASTATIN 40 MG PO TABS: 40.0000 mg | ORAL_TABLET | Freq: Every day | ORAL | 3 refills | Status: DC

## 2021-12-24 NOTE — Progress Notes (Signed)
Virtual Visit via Telephone Note ? ?I connected with Orlene Erm on 12/24/21 at 11:40 AM EDT by telephone and verified that I am speaking with the correct person using two identifiers. ? ?Location: ?Patient: home ?Provider: office ?  ?I discussed the limitations, risks, security and privacy concerns of performing an evaluation and management service by telephone and the availability of in person appointments. I also discussed with the patient that there may be a patient responsible charge related to this service. The patient expressed understanding and agreed to proceed. ? ? ?History of Present Illness: ?Increased stress , anxiety and depression following recent loss of spouse of many years, having uncontrolled anxiety and panic attacks, not suicidal or homicidal, states children are supportive ?Not suicidal or homicidal ?  ?Observations/Objective: ? ?There were no vitals taken for this visit. ?Good communication with no confusion and intact memory. ?Alert and oriented x 3 ?No signs of respiratory distress during speech ? ?Assessment and Plan: ? ?GAD (generalized anxiety disorder) ?Increased and uncontrolled, mourning  Recent loss of spouse and feels overwhelmed, inc buspar to 3 times daily ? ? ?Follow Up Instructions: ? ?  ?I discussed the assessment and treatment plan with the patient. The patient was provided an opportunity to ask questions and all were answered. The patient agreed with the plan and demonstrated an understanding of the instructions. ?  ?The patient was advised to call back or seek an in-person evaluation if the symptoms worsen or if the condition fails to improve as anticipated. ? ?I provided 9 minutes of non-face-to-face time during this encounter. ? ? ?Tula Nakayama, MD ? ?

## 2021-12-24 NOTE — Patient Instructions (Addendum)
F/U in May as before, call if you need me sooner ? ?Medications have been sent to your pharmacy , as we discussed. ? ?Increase in buspar to three times daily, and new additional is hydroxyzine , one at bedtime ? ?Thanks for choosing [facility], we consider it a privelige to serve you. ? ?

## 2022-01-03 ENCOUNTER — Encounter: Payer: Self-pay | Admitting: Family Medicine

## 2022-01-03 NOTE — Assessment & Plan Note (Signed)
Increased and uncontrolled, mourning  Recent loss of spouse and feels overwhelmed, inc buspar to 3 times daily ?

## 2022-01-15 ENCOUNTER — Telehealth: Payer: Self-pay | Admitting: Family Medicine

## 2022-01-15 ENCOUNTER — Other Ambulatory Visit: Payer: Self-pay | Admitting: Family Medicine

## 2022-01-15 NOTE — Telephone Encounter (Signed)
Patient called in regard to meds ? ?busPIRone (BUSPAR) 5 MG tablet  ?Makes patient feel confused , dizzy, sweaty and nasus ? ?hydrOXYzine (ATARAX) 25 MG tablet  ?Is not helping patient with sleep  ? ?Patient wants a call back  ?

## 2022-01-18 NOTE — Telephone Encounter (Signed)
Patient will wait 05.24 appt to review meds with her cpe appt ?

## 2022-02-02 ENCOUNTER — Encounter: Payer: Medicare HMO | Admitting: Family Medicine

## 2022-02-03 ENCOUNTER — Telehealth: Payer: Self-pay | Admitting: *Deleted

## 2022-02-08 ENCOUNTER — Ambulatory Visit (INDEPENDENT_AMBULATORY_CARE_PROVIDER_SITE_OTHER): Payer: Medicare HMO

## 2022-02-08 DIAGNOSIS — Z0000 Encounter for general adult medical examination without abnormal findings: Secondary | ICD-10-CM

## 2022-02-08 NOTE — Patient Instructions (Signed)
  Amanda Simon , Thank you for taking time to come for your Medicare Wellness Visit. I appreciate your ongoing commitment to your health goals. Please review the following plan we discussed and let me know if I can assist you in the future.   Colonoscopy due, mammogram due, and 2nd shingrix due at pharmacy.   Will see if Dr Moshe Cipro has a referral recommendation to another primary provider closer to your home.    These are the goals we discussed:  Goals      Exercise 3x per week (30 min per time)     Recommend increasing your exercise program to at least 3 days a week for 30-45 minutes at a time as tolerated.        LIFESTYLE - DECREASE FALLS RISK     Quit Smoking     Patient down to 2-3 per day      Weight (lb) < 190 lb (86.2 kg)        This is a list of the screening recommended for you and due dates:  Health Maintenance  Topic Date Due   Colon Cancer Screening  02/20/2020   Tetanus Vaccine  12/30/2020   COVID-19 Vaccine (5 - Booster for Pfizer series) 05/26/2021   Mammogram  08/18/2021   Zoster (Shingles) Vaccine (2 of 2) 09/10/2021   Flu Shot  04/20/2022   Pneumonia Vaccine  Completed   DEXA scan (bone density measurement)  Completed   Hepatitis C Screening: USPSTF Recommendation to screen - Ages 27-79 yo.  Completed   HPV Vaccine  Aged Out

## 2022-02-08 NOTE — Progress Notes (Addendum)
Subjective:   Amanda Simon is a 71 y.o. female who presents for Medicare Annual (Subsequent) preventive examination.  Review of Systems      Cardiac Risk Factors include: advanced age (>46mn, >>6women);smoking/ tobacco exposure;hypertension;dyslipidemia     Objective:    There were no vitals filed for this visit. There is no height or weight on file to calculate BMI.     02/08/2022    9:54 AM 01/28/2021    9:04 AM 01/14/2020    3:01 PM 06/29/2017    2:59 PM 02/20/2015    8:14 AM  Advanced Directives  Does Patient Have a Medical Advance Directive? No No No No No  Would patient like information on creating a medical advance directive? Yes (ED - Information included in AVS) No - Patient declined Yes (ED - Information included in AVS) Yes (MAU/Ambulatory/Procedural Areas - Information given) No - patient declined information    Current Medications (verified) Outpatient Encounter Medications as of 02/08/2022  Medication Sig   busPIRone (BUSPAR) 5 MG tablet TAKE 1 TABLET BY MOUTH THREE TIMES A DAY   cholecalciferol (VITAMIN D3) 25 MCG (1000 UNIT) tablet Take 1,000 Units by mouth daily. Takes 5000 units daily   hydrOXYzine (ATARAX) 25 MG tablet Take 1 tablet (25 mg total) by mouth every 6 (six) hours. (Patient not taking: Reported on 12/24/2021)   hydrOXYzine (VISTARIL) 25 MG capsule TAKE ONE TABLET BY MOUTH AT BEDTIME FOR SLEEP AND ANXIETY   losartan-hydrochlorothiazide (HYZAAR) 100-25 MG tablet Take 1 tablet by mouth daily.   simvastatin (ZOCOR) 40 MG tablet Take 1 tablet (40 mg total) by mouth at bedtime.   No facility-administered encounter medications on file as of 02/08/2022.    Allergies (verified) Metformin and related   History: Past Medical History:  Diagnosis Date   Depression    Depression, major, single episode, moderate ([place] 08/27/2018   PHQ 9 score of 14 in 08/2018, not suicidal or homicidal, medication started and referred to telethewrapy   Hyperlipidemia     Hypertension    IGT (impaired glucose tolerance) 2014   Obesity    Osteopenia    Past Surgical History:  Procedure Laterality Date   ABDOMINAL HYSTERECTOMY  1975   fibroids   COLONOSCOPY N/A 02/20/2015   Procedure: COLONOSCOPY;  Surgeon: NRogene Houston MD;  [facility];  Service: Endoscopy;  Laterality: N/A;  930   PARTIAL HYSTERECTOMY     TUBAL LIGATION     Family History  Problem Relation Age of Onset   Lung cancer Mother    Aneurysm Father        brain    Hypertension Sister    Hypertension Sister    Social History   Socioeconomic History   Marital status: Married    Spouse name: Not on file   Number of children: Not on file   Years of education: Not on file   Highest education level: Not on file  Occupational History   Occupation: unemployed   Tobacco Use   Smoking status: Some Days    Packs/day: 0.25    Years: 35.00    Pack years: 8.75    Types: Cigarettes    Last attempt to quit: 03/01/2017    Years since quitting: 4.9   Smokeless tobacco: Never   Tobacco comments:    2 per day   Substance and Sexual Activity   Alcohol use: No    Alcohol/week: 0.0 standard drinks   Drug use: No  Sexual activity: Yes    Birth control/protection: Surgical  Other Topics Concern   Not on file  Social History Narrative   Not on file   Social Determinants of Health   Financial Resource Strain: Low Risk    Difficulty of Paying Living Expenses: Not very hard  Food Insecurity: No Food Insecurity   Worried About Running Out of Food in the Last Year: Never true   Ran Out of Food in the Last Year: Never true  Transportation Needs: No Transportation Needs   Lack of Transportation (Medical): No   Lack of Transportation (Non-Medical): No  Physical Activity: Sufficiently Active   Days of Exercise per Week: 7 days   Minutes of Exercise per Session: 30 min  Stress: Not on file  Social Connections: Not on file    Tobacco Counseling Ready to quit: Not  Answered Counseling given: Not Answered Tobacco comments: 2 per day    Clinical Intake:  Pre-visit preparation completed: No  Pain : No/denies pain     Nutritional Status: BMI 25 -29 Overweight Diabetes: No  How often do you need to have someone help you when you read instructions, pamphlets, or other written materials from your doctor or pharmacy?: 1 - Never  [place] Needed?: No      Activities of Daily Living    02/08/2022    9:53 AM  In your present state of health, do you have any difficulty performing the following activities:  Hearing? 0  Vision? 0  Difficulty concentrating or making decisions? 0  Walking or climbing stairs? 0  Dressing or bathing? 0  Doing errands, shopping? 0  Preparing Food and eating ? N  Using the Toilet? N  In the past six months, have you accidently leaked urine? N  Do you have problems with loss of bowel control? N  Managing your Medications? N  Managing your Finances? N  Housekeeping or managing your Housekeeping? N    Patient Care Team: Fayrene Helper, MD as PCP - General  Indicate any recent Medical Services you may have received from other than [facility] providers in the past year (date may be approximate).     Assessment:   This is a routine wellness examination for Amanda Simon.  Hearing/Vision screen No results found.  Dietary issues and exercise activities discussed: Current Exercise Habits: The patient has a physically strenuous job, but has no regular exercise apart from work.   Goals Addressed             This Visit's Progress    Exercise 3x per week (30 min per time)   On track    Recommend increasing your exercise program to at least 3 days a week for 30-45 minutes at a time as tolerated.        Quit Smoking   Not on track    Patient down to 2-3 per day        Depression Screen    12/24/2021   11:29 AM 06/24/2021    8:40 AM 06/24/2021    8:27 AM 02/05/2021   10:32 AM 02/05/2021   10:28  AM 01/28/2021    9:05 AM 01/14/2020    3:01 PM  PHQ 2/9 Scores  PHQ - 2 Score 1 0 0 3 0 0 0  PHQ- 9 Score    15   0    Fall Risk    12/24/2021   11:29 AM 06/24/2021    8:27 AM 02/05/2021   10:41 AM 02/05/2021  10:28 AM 01/28/2021    9:05 AM  Fall Risk   Falls in the past year? 0 0 0 0 0  Number falls in past yr: 0 0 0 0 0  Injury with Fall? 0 0  0 0  Risk for fall due to : No Fall Risks    No Fall Risks  Follow up Falls evaluation completed    Falls evaluation completed;Falls prevention discussed    FALL RISK PREVENTION PERTAINING TO THE HOME:  Any stairs in or around the home? Yes  If so, are there any without handrails? No  Home free of loose throw rugs in walkways, pet beds, electrical cords, etc? Yes  Adequate lighting in your home to reduce risk of falls? Yes   ASSISTIVE DEVICES UTILIZED TO PREVENT FALLS:  Life alert? No  Use of a cane, walker or w/c? Yes  Grab bars in the bathroom? No  Shower chair or bench in shower? yes Elevated toilet seat or a handicapped toilet? no Cognitive Function:        01/14/2020    3:02 PM 01/09/2019    9:32 AM 06/29/2017    3:02 PM  6CIT Screen  What Year? 0 points 0 points 0 points  What month? 0 points 0 points 0 points  What time? 0 points 0 points 0 points  Count back from 20 0 points 0 points 0 points  Months in reverse 0 points 0 points 0 points  Repeat phrase 0 points 0 points 0 points  Total Score 0 points 0 points 0 points    Immunizations Immunization History  Administered Date(s) Administered   Fluad Quad(high Dose 65+) 05/24/2019, 06/18/2020, 06/24/2021   Influenza Whole 08/02/2011   Influenza, High Dose Seasonal PF 08/24/2018   Influenza,inj,Quad PF,6+ Mos 06/11/2013, 09/24/2014, 07/22/2015, 08/19/2016, 06/29/2017   PFIZER(Purple Top)SARS-COV-2 Vaccination 11/01/2019, 11/26/2019, 10/09/2020   Pneumococcal Conjugate-13 02/05/2015   Pneumococcal Polysaccharide-23 10/21/2016   Tdap 12/31/2010   Zoster, Live  12/31/2010    TDAP status: Due, Education has been provided regarding the importance of this vaccine. Advised may receive this vaccine at local pharmacy or Health Dept. Aware to provide a copy of the vaccination record if obtained from local pharmacy or Health Dept. Verbalized acceptance and understanding.  Flu Vaccine status: Up to date  Pneumococcal vaccine status: Up to date  Covid-19 vaccine status: Completed vaccines  Qualifies for Shingles Vaccine? Yes   Zostavax completed No   Shingrix Completed?: Yes  Screening Tests Health Maintenance  Topic Date Due   COLONOSCOPY (Pts 45-51yr Insurance coverage will need to be confirmed)  02/20/2020   TETANUS/TDAP  12/30/2020   COVID-19 Vaccine (5 - Booster for Pfizer series) 05/26/2021   MAMMOGRAM  08/18/2021   Zoster Vaccines- Shingrix (2 of 2) 09/10/2021   INFLUENZA VACCINE  04/20/2022   Pneumonia Vaccine 71 Years old  Completed   DEXA SCAN  Completed   Hepatitis C Screening  Completed   HPV VACCINES  Aged Out    Health Maintenance  Health Maintenance Due  Topic Date Due   COLONOSCOPY (Pts 45-430yrInsurance coverage will need to be confirmed)  02/20/2020   TETANUS/TDAP  12/30/2020   COVID-19 Vaccine (5 - Booster for Pfizer series) 05/26/2021   MAMMOGRAM  08/18/2021   Zoster Vaccines- Shingrix (2 of 2) 09/10/2021    Colonoscopy- due- doesn't want to schedule now   Mammogram status: Ordered yes. Pt provided with contact info and advised to call to schedule appt.   Bone Density status:  Ordered yes. Pt provided with contact info and advised to call to schedule appt.  Lung Cancer Screening: (Low Dose CT Chest recommended if Age 55-80 years, 30 pack-year currently smoking OR have quit w/in 15years.) does qualify.   Lung Cancer Screening Referral: referred   Additional Screening:  Hepatitis C Screening: does qualify; Completed yes  Vision Screening: Recommended annual ophthalmology exams for early detection of glaucoma  and other disorders of the eye. Is the patient up to date with their annual eye exam?  Yes  Who is the provider or what is the name of the office in which the patient attends annual eye exams? [facility] If pt is not established with a provider, would they like to be referred to a provider to establish care? No .   Dental Screening: Recommended annual dental exams for proper oral hygiene  Community Resource Referral / Chronic Care Management: CRR required this visit?  No   CCM required this visit?  No      Plan:     I have personally reviewed and noted the following in the patient's chart:   Medical and social history Use of alcohol, tobacco or illicit drugs  Current medications and supplements including opioid prescriptions.  Functional ability and status Nutritional status Physical activity Advanced directives List of other physicians Hospitalizations, surgeries, and ER visits in previous 12 months Vitals Screenings to include cognitive, depression, and falls Referrals and appointments  In addition, I have reviewed and discussed with patient certain preventive protocols, quality metrics, and best practice recommendations. A written personalized care plan for preventive services as well as general preventive health recommendations were provided to patient.     Eual Fines, LPN   2/99/2426   Nurse Notes:  Amanda Simon , Thank you for taking time to come for your Medicare Wellness Visit. I appreciate your ongoing commitment to your health goals. Please review the following plan we discussed and let me know if I can assist you in the future.   These are the goals we discussed:  Goals      Exercise 3x per week (30 min per time)     Recommend increasing your exercise program to at least 3 days a week for 30-45 minutes at a time as tolerated.        LIFESTYLE - DECREASE FALLS RISK     Quit Smoking     Patient down to 2-3 per day      Weight (lb) < 190  lb (86.2 kg)        This is a list of the screening recommended for you and due dates:  Health Maintenance  Topic Date Due   Colon Cancer Screening  02/20/2020   Tetanus Vaccine  12/30/2020   COVID-19 Vaccine (5 - Booster for Pfizer series) 05/26/2021   Mammogram  08/18/2021   Zoster (Shingles) Vaccine (2 of 2) 09/10/2021   Flu Shot  04/20/2022   Pneumonia Vaccine  Completed   DEXA scan (bone density measurement)  Completed   Hepatitis C Screening: USPSTF Recommendation to screen - Ages 5-79 yo.  Completed   HPV Vaccine  Aged Out     I have reviewed and agree with the above AWV documentation. Amanda Simon. Moshe Cipro, MD [facility] Primary Care Location Provider: office Location Patient: home

## 2022-02-10 ENCOUNTER — Encounter: Payer: Medicare HMO | Admitting: Family Medicine

## 2022-02-25 ENCOUNTER — Encounter: Payer: Self-pay | Admitting: Family Medicine

## 2022-02-25 ENCOUNTER — Ambulatory Visit (INDEPENDENT_AMBULATORY_CARE_PROVIDER_SITE_OTHER): Payer: Medicare HMO | Admitting: Family Medicine

## 2022-02-25 ENCOUNTER — Encounter: Payer: Medicare HMO | Admitting: Family Medicine

## 2022-02-25 VITALS — BP 115/71 | HR 76 | Ht 65.0 in | Wt 177.1 lb

## 2022-02-25 DIAGNOSIS — I10 Essential (primary) hypertension: Secondary | ICD-10-CM | POA: Diagnosis not present

## 2022-02-25 DIAGNOSIS — Z0000 Encounter for general adult medical examination without abnormal findings: Secondary | ICD-10-CM | POA: Diagnosis not present

## 2022-02-25 DIAGNOSIS — E559 Vitamin D deficiency, unspecified: Secondary | ICD-10-CM

## 2022-02-25 DIAGNOSIS — E782 Mixed hyperlipidemia: Secondary | ICD-10-CM | POA: Diagnosis not present

## 2022-02-25 DIAGNOSIS — D126 Benign neoplasm of colon, unspecified: Secondary | ICD-10-CM

## 2022-02-25 DIAGNOSIS — R7302 Impaired glucose tolerance (oral): Secondary | ICD-10-CM | POA: Diagnosis not present

## 2022-02-25 DIAGNOSIS — Z1231 Encounter for screening mammogram for malignant neoplasm of breast: Secondary | ICD-10-CM

## 2022-02-25 DIAGNOSIS — F17219 Nicotine dependence, cigarettes, with unspecified nicotine-induced disorders: Secondary | ICD-10-CM

## 2022-02-25 NOTE — Patient Instructions (Signed)
All the best, it has been a privilege caring for you, !  Please provide pt with list of Primary care offices through [facility] health in [place]  Please schedule mammogram at checkout at the [place]  Please refer for a screening colonoscopy to Provider who did the previous one , I will sign 9nurse pls do this)  You Need Shingrix #2  You need Covid Booster  You need TdAP   Get all vaccines at your  Pharmacy Please  Labs today, fasting, come to office at 12 :45 , CBC, lipid, cmp and EGFr, HBA1C, TSH and vit D  Keep active  Intentionally eat more often and a bit more food  Continue great sleep   ,ty

## 2022-02-26 LAB — CMP14+EGFR
ALT: 20 IU/L (ref 0–32)
AST: 19 IU/L (ref 0–40)
Albumin/Globulin Ratio: 1.6 (ref 1.2–2.2)
Albumin: 4.4 g/dL (ref 3.7–4.7)
Alkaline Phosphatase: 90 IU/L (ref 44–121)
BUN/Creatinine Ratio: 19 (ref 12–28)
BUN: 13 mg/dL (ref 8–27)
Bilirubin Total: 0.7 mg/dL (ref 0.0–1.2)
CO2: 26 mmol/L (ref 20–29)
Calcium: 9.4 mg/dL (ref 8.7–10.3)
Chloride: 101 mmol/L (ref 96–106)
Creatinine, Ser: 0.69 mg/dL (ref 0.57–1.00)
Globulin, Total: 2.7 g/dL (ref 1.5–4.5)
Glucose: 78 mg/dL (ref 70–99)
Potassium: 4.1 mmol/L (ref 3.5–5.2)
Sodium: 142 mmol/L (ref 134–144)
Total Protein: 7.1 g/dL (ref 6.0–8.5)
eGFR: 92 mL/min/{1.73_m2} (ref >59)

## 2022-02-26 LAB — VITAMIN D 25 HYDROXY (VIT D DEFICIENCY, FRACTURES): Vit D, 25-Hydroxy: 31.5 ng/mL (ref 30.0–100.0)

## 2022-02-26 LAB — LIPID PANEL
Chol/HDL Ratio: 3.1 ratio (ref 0.0–4.4)
Cholesterol, Total: 188 mg/dL (ref 100–199)
HDL: 61 mg/dL (ref >39)
LDL Chol Calc (NIH): 114 mg/dL — ABNORMAL HIGH (ref 0–99)
Triglycerides: 68 mg/dL (ref 0–149)
VLDL Cholesterol Cal: 13 mg/dL (ref 5–40)

## 2022-02-26 LAB — TSH: TSH: 0.692 u[IU]/mL (ref 0.450–4.500)

## 2022-02-26 LAB — HEMOGLOBIN A1C
Est. average glucose Bld gHb Est-mCnc: 123 mg/dL
Hgb A1c MFr Bld: 5.9 % — ABNORMAL HIGH (ref 4.8–5.6)

## 2022-02-26 LAB — CBC
Hematocrit: 39.1 % (ref 34.0–46.6)
Hemoglobin: 12.9 g/dL (ref 11.1–15.9)
MCH: 29.4 pg (ref 26.6–33.0)
MCHC: 33 g/dL (ref 31.5–35.7)
MCV: 89 fL (ref 79–97)
Platelets: 278 10*3/uL (ref 150–450)
RBC: 4.39 x10E6/uL (ref 3.77–5.28)
RDW: 12.1 % (ref 11.7–15.4)
WBC: 8.8 10*3/uL (ref 3.4–10.8)

## 2022-03-01 ENCOUNTER — Telehealth: Payer: Self-pay | Admitting: Family Medicine

## 2022-03-01 NOTE — Telephone Encounter (Signed)
Patient called in regard to staying with practice.  Patient wants to stay with provider. Patient has worked with daughter on arrangements to be able to get to office.

## 2022-03-02 ENCOUNTER — Encounter: Payer: Self-pay | Admitting: Family Medicine

## 2022-03-02 NOTE — Telephone Encounter (Signed)
Called patient will check with daughter and give our office a call back to schedule in November.

## 2022-03-02 NOTE — Assessment & Plan Note (Signed)

## 2022-03-02 NOTE — Progress Notes (Signed)
    Amanda Simon     MRN: [id number]      DOB: 07/15/1951  HPI: Patient is in for annual physical exam. No other health concerns are expressed or addressed at the visit. Recent labs,  are reviewed. Immunization is reviewed , and  updated if needed.   PE: BP 115/71   Pulse 76   Ht '5\' 5"'$  (1.651 m)   Wt 177 lb 1.9 oz (80.3 kg)   SpO2 94%   BMI 29.47 kg/m   Pleasant  female, alert and oriented x 3, in no cardio-pulmonary distress. Afebrile. HEENT No facial trauma or asymetry. Sinuses non tender.  Extra occullar muscles intact.. External ears normal, . Neck: supple, no adenopathy,JVD or thyromegaly.No bruits.  Chest: Clear to ascultation bilaterally.No crackles or wheezes. Non tender to palpation  Breast: Not examined  Cardiovascular system; Heart sounds normal,  S1 and  S2 ,no S3.  No murmur, or thrill. Apical beat not displaced Peripheral pulses normal.  Abdomen: Soft, non tender, no organomegaly or masses. No bruits. Bowel sounds normal. No guarding, tenderness or rebound.    Musculoskeletal exam: Full ROM of spine, hips , shoulders and knees. No deformity ,swelling or crepitus noted. No muscle wasting or atrophy.   Neurologic: Cranial nerves 2 to 12 intact. Power, tone ,sensation and reflexes normal throughout. No disturbance in gait. No tremor.  Skin: Intact, no ulceration, erythema , scaling or rash noted. Pigmentation normal throughout  Psych; Normal mood and affect. Judgement and concentration normal   Assessment & Plan:  Annual physical exam Annual exam as documented. Counseling done  re healthy lifestyle involving commitment to 150 minutes exercise per week, heart healthy diet, and attaining healthy weight.The importance of adequate sleep also discussed. Regular seat belt use and home safety, is also discussed. Changes in health habits are decided on by the patient with goals and time frames  set for achieving them. Immunization and cancer  screening needs are specifically addressed at this visit.

## 2022-03-09 ENCOUNTER — Encounter (INDEPENDENT_AMBULATORY_CARE_PROVIDER_SITE_OTHER): Payer: Self-pay | Admitting: *Deleted

## 2022-03-12 ENCOUNTER — Ambulatory Visit: Payer: Medicare HMO

## 2022-04-01 ENCOUNTER — Ambulatory Visit (HOSPITAL_COMMUNITY): Payer: Medicare HMO

## 2022-04-07 ENCOUNTER — Encounter: Payer: Self-pay | Admitting: Nurse Practitioner

## 2022-04-07 ENCOUNTER — Ambulatory Visit (INDEPENDENT_AMBULATORY_CARE_PROVIDER_SITE_OTHER): Payer: Medicare HMO | Admitting: Nurse Practitioner

## 2022-04-07 ENCOUNTER — Telehealth: Payer: Self-pay

## 2022-04-07 DIAGNOSIS — S60561A Insect bite (nonvenomous) of right hand, initial encounter: Secondary | ICD-10-CM | POA: Diagnosis not present

## 2022-04-07 DIAGNOSIS — W57XXXA Bitten or stung by nonvenomous insect and other nonvenomous arthropods, initial encounter: Secondary | ICD-10-CM | POA: Diagnosis not present

## 2022-04-07 MED ORDER — TRIAMCINOLONE ACETONIDE 0.1 % EX CREA: 1.0000 | TOPICAL_CREAM | Freq: Two times a day (BID) | CUTANEOUS | 0 refills | Status: DC

## 2022-04-07 NOTE — Patient Instructions (Signed)
Apply Kenalog 0.1% cream twice daily to the affected site Take hydroxyzine 25 mg 3 times daily as needed itching.   Thanks for choosing [facility], we consider it a privelige to serve you.

## 2022-04-07 NOTE — Telephone Encounter (Signed)
Can schedule virtual with available provider.

## 2022-04-07 NOTE — Telephone Encounter (Signed)
Patient called and was stung by wasp yesterday afternoon on the front porch and hand is swollen. Patient was wandering if provider could call something in to her pharmacy or what could she used on her hand.  Patient is unable to come into the office, no transportation.   Also, wanted to let provider know she did not get her CT scan  done was sick could not make it to this appointment due to her husband passing 4 months ago and feeling real bad.  Please have nurse return patient call [phone].

## 2022-04-07 NOTE — Telephone Encounter (Signed)
Scheduled virtual visit °

## 2022-04-07 NOTE — Progress Notes (Signed)
Virtual Visit via Telephone Note  I connected with Amanda Simon on 04/07/22 at 1057am by telephone and verified that I am speaking with the correct person using two identifiers.  I spent 8 minutes on this telephone encounter.  Location: Patient: home Provider: office   I discussed the limitations, risks, security and privacy concerns of performing an evaluation and management service by telephone and the availability of in person appointments. I also discussed with the patient that there may be a patient responsible charge related to this service. The patient expressed understanding and agreed to proceed.   History of Present Illness: Amanda Simon with past medical history of hypertension, hyperlipidemia, vitamin D deficiency, GAD presents with c/o of  wasp sting to her  right hand on 04/06/2022 , right hand is swollen, red and itchy. Denies SOB, wheezing, cough, body aches, malaise.  Has not taken current medications for her symptoms.  Currently takes hydroxyzine 25 mg as needed at bedtime for insomnia   Observations/Objective:   Assessment and Plan: Insect bite of right hand Wasp bite 1 day ago Apply Kenalog 0.1% cream twice daily to the affected site Patient told to take hydroxyzine 25 mg 3 times daily as needed itching.     Follow Up Instructions:    I discussed the assessment and treatment plan with the patient. The patient was provided an opportunity to ask questions and all were answered. The patient agreed with the plan and demonstrated an understanding of the instructions.   The patient was advised to call back or seek an in-person evaluation if the symptoms worsen or if the condition fails to improve as anticipated.

## 2022-04-07 NOTE — Assessment & Plan Note (Signed)
Wasp bite 1 day ago Apply Kenalog 0.1% cream twice daily to the affected site Patient told to take hydroxyzine 25 mg 3 times daily as needed itching.

## 2022-04-20 ENCOUNTER — Telehealth: Payer: Self-pay | Admitting: Family Medicine

## 2022-04-20 NOTE — Telephone Encounter (Signed)
States sometimes she can feel her blood sugar is elevated and wants a script again for metformin to take as needed or something else she can take as needed

## 2022-04-26 NOTE — Telephone Encounter (Signed)
Patient advised with verbal understanding  

## 2022-04-26 NOTE — Telephone Encounter (Signed)
LVM for pt to call the office.

## 2022-06-03 NOTE — Telephone Encounter (Signed)
No entry 

## 2022-06-16 ENCOUNTER — Encounter (INDEPENDENT_AMBULATORY_CARE_PROVIDER_SITE_OTHER): Payer: Self-pay

## 2022-07-05 ENCOUNTER — Ambulatory Visit
Admission: EM | Admit: 2022-07-05 | Discharge: 2022-07-05 | Disposition: A | Discharge status: Home / Self Care | Payer: Medicare HMO | Attending: Internal Medicine | Admitting: Internal Medicine

## 2022-07-05 DIAGNOSIS — R42 Dizziness and giddiness: Secondary | ICD-10-CM

## 2022-07-05 DIAGNOSIS — R202 Paresthesia of skin: Secondary | ICD-10-CM | POA: Diagnosis not present

## 2022-07-05 LAB — POCT FASTING CBG KUC MANUAL ENTRY: POCT Glucose (KUC): 135 mg/dL — AB (ref 70–99)

## 2022-07-05 NOTE — Discharge Instructions (Signed)
Your blood sugar was unremarkable.  Blood work is pending.  We will call if it is abnormal.  Please follow-up with your primary care doctor soon as possible to schedule appointment for further evaluation and management.  Go to the emergency department if you develop blurred vision, chest pain, shortness of breath, or any worsening symptoms.

## 2022-07-05 NOTE — ED Provider Notes (Signed)
[facility]    CSN: [id number] Arrival date & time: 07/05/22  1424      History   Chief Complaint Chief Complaint  Patient presents with   Dizziness    HPI Amanda Simon is a 71 y.o. female.   Patient presents with intermittent dizziness that has been present for 3 days.  Patient reports it mainly occurs with position changes and with turning head.  Patient reports that she has been having associated numbness and tingling in her fingertips.  She describes it as not being able to feel the buttons on her phone and television remote.  Denies headache, blurred vision, chest pain, shortness of breath, nausea, vomiting.  Patient also denies any recent falls or head injuries.  Denies dysuria, urinary frequency, diarrhea, blood in stool.  Denies any recent medication changes.  Denies any associated fever.  Patient denies that she has ever had similar symptoms.   Dizziness   Past Medical History:  Diagnosis Date   Depression    Depression, major, single episode, moderate ([place]) 08/27/2018   PHQ 9 score of 14 in 08/2018, not suicidal or homicidal, medication started and referred to telethewrapy   Hyperlipidemia    Hypertension    IGT (impaired glucose tolerance) 2014   Obesity    Osteopenia     Patient Active Problem List   Diagnosis Date Noted   Insect bite of right hand 04/07/2022   GAD (generalized anxiety disorder) 12/24/2021   Vitamin D deficiency 08/06/2016   Tubular adenoma of colon 07/22/2015   Annual physical exam 07/22/2015   Cigarette nicotine dependence with nicotine-induced disorder 03/28/2012   IGT (impaired glucose tolerance) 12/05/2011   Hyperlipemia 01/02/2008   Obesity (BMI 30.0-34.9) 01/02/2008   Essential hypertension 01/02/2008   OSTEOPENIA 01/02/2008    Past Surgical History:  Procedure Laterality Date   ABDOMINAL HYSTERECTOMY  1975   fibroids   COLONOSCOPY N/A 02/20/2015   Procedure: COLONOSCOPY;  Surgeon: Rogene Houston, MD;   Location: [facility];  Service: Endoscopy;  Laterality: N/A;  930   PARTIAL HYSTERECTOMY     TUBAL LIGATION      OB History   No obstetric history on file.      Home Medications    Prior to Admission medications   Medication Sig Start Date End Date Taking? Authorizing Provider  busPIRone (BUSPAR) 5 MG tablet TAKE 1 TABLET BY MOUTH THREE TIMES A DAY 01/15/22   Fayrene Helper, MD  cholecalciferol (VITAMIN D3) 25 MCG (1000 UNIT) tablet Take 1,000 Units by mouth daily. Takes 5000 units daily    [provider]  hydrOXYzine (VISTARIL) 25 MG capsule Take one capsule at bedtime , as needed, for sleeop and anxiety 02/25/22   Fayrene Helper, MD  losartan-hydrochlorothiazide (HYZAAR) 100-25 MG tablet Take 1 tablet by mouth daily. 12/24/21   Fayrene Helper, MD  simvastatin (ZOCOR) 40 MG tablet Take 1 tablet (40 mg total) by mouth at bedtime. 12/24/21   Fayrene Helper, MD  triamcinolone cream (KENALOG) 0.1 % Apply 1 Application topically 2 (two) times daily. 04/07/22   Renee Rival, FNP    Family History Family History  Problem Relation Age of Onset   Lung cancer Mother    Aneurysm Father        brain    Hypertension Sister    Hypertension Sister     Social History Social History   Tobacco Use   Smoking status: Some Days    Packs/day:  0.25    Years: 35.00    Total pack years: 8.75    Types: Cigarettes    Last attempt to quit: 03/01/2017    Years since quitting: 5.3   Smokeless tobacco: Never   Tobacco comments:    2 per day   Substance Use Topics   Alcohol use: No    Alcohol/week: 0.0 standard drinks of alcohol   Drug use: No     Allergies   Metformin and related   Review of Systems Review of Systems Per HPI  Physical Exam Triage Vital Signs ED Triage Vitals  Enc Vitals Group     BP 07/05/22 1543 114/72     Pulse Rate 07/05/22 1543 75     Resp 07/05/22 1543 18     Temp 07/05/22 1543 98.2 F (36.8 C)     Temp Source 07/05/22 1543  Oral     SpO2 07/05/22 1543 96 %     Weight --      Height --      Head Circumference --      Peak Flow --      Pain Score 07/05/22 1541 0     Pain Loc --      Pain Edu? --      Excl. in [place]? --    No data found.  Updated Vital Signs BP 125/72   Pulse 79   Temp 98.2 F (36.8 C) (Oral)   Resp 18   SpO2 96%   Visual Acuity Right Eye Distance:   Left Eye Distance:   Bilateral Distance:    Right Eye Near:   Left Eye Near:    Bilateral Near:     Physical Exam Constitutional:      General: She is not in acute distress.    Appearance: Normal appearance. She is not toxic-appearing or diaphoretic.  HENT:     Head: Normocephalic and atraumatic.  Eyes:     Extraocular Movements: Extraocular movements intact.     Conjunctiva/sclera: Conjunctivae normal.     Pupils: Pupils are equal, round, and reactive to light.  Cardiovascular:     Rate and Rhythm: Normal rate and regular rhythm.     Pulses: Normal pulses.     Heart sounds: Normal heart sounds.  Pulmonary:     Effort: Pulmonary effort is normal. No respiratory distress.     Breath sounds: Normal breath sounds.  Abdominal:     General: Bowel sounds are normal. There is no distension.     Palpations: Abdomen is soft.     Tenderness: There is no abdominal tenderness.  Neurological:     General: No focal deficit present.     Mental Status: She is alert and oriented to person, place, and time. Mental status is at baseline.     Cranial Nerves: Cranial nerves 2-12 are intact.     Sensory: Sensation is intact.     Motor: Motor function is intact.     Coordination: Coordination is intact.     Gait: Gait is intact.  Psychiatric:        Mood and Affect: Mood normal.        Behavior: Behavior normal.        Thought Content: Thought content normal.        Judgment: Judgment normal.      UC Treatments / Results  Labs (all labs ordered are listed, but only abnormal results are displayed) Labs Reviewed  POCT FASTING CBG  [place] -  Abnormal; Notable for the following components:      Result Value   POCT Glucose ([facility]) 135 (*)    All other components within normal limits  CBC  COMPREHENSIVE METABOLIC PANEL    EKG   Radiology No results found.  Procedures Procedures (including critical care time)  Medications Ordered in UC Medications - No data to display  Initial Impression / Assessment and Plan / UC Course  I have reviewed the triage vital signs and the nursing notes.  Pertinent labs & imaging results that were available during my care of the patient were reviewed by me and considered in my medical decision making (see chart for details).     Patient reporting intermittent dizziness and bilateral hand/fingertip paresthesias.  Physical exam and neuro exam is unremarkable.  Patient is alert and in no acute distress.  Given duration of symptoms, paresthesias being bilateral, and normal physical exam, do not think that emergent evaluation or imaging of the head is necessary at this time.  Vital signs are also stable.  Orthostatic vital signs were unremarkable.  Blood glucose was normal.  Will obtain CMP and CBC to determine any other worrisome etiologies.  Patient was encouraged to following up with PCP as soon as possible for further evaluation and management.  Patient advised to go to the ER if any symptoms persist or worsen or she develops headache, blurred vision, nausea, vomiting, chest pain, shortness of breath.  Patient verbalized understanding was agreeable with plan. Final Clinical Impressions(s) / UC Diagnoses   Final diagnoses:  Dizziness and giddiness  Paresthesia of both hands     Discharge Instructions      Your blood sugar was unremarkable.  Blood work is pending.  We will call if it is abnormal.  Please follow-up with your primary care doctor soon as possible to schedule appointment for further evaluation and management.  Go to the emergency department if you develop blurred  vision, chest pain, shortness of breath, or any worsening symptoms.    ED Prescriptions   None    PDMP not reviewed this encounter.   Teodora Medici, [place] 07/05/22 1642

## 2022-07-05 NOTE — ED Triage Notes (Signed)
Pt presents with room spinning when she lays down and feels wobbly when she stands up x 3 days.  Also states that she feels that way when she turns her head too fast as well.  States once she is up moving around she feels ok.    Reports fingers aren't as sensitive lately as well.    States her husband passed about 8 mos ago and feels like she has just gone downhill since then.  Has lost about 50lbs since then.

## 2022-07-07 LAB — COMPREHENSIVE METABOLIC PANEL
ALT: 15 IU/L (ref 0–32)
AST: 19 IU/L (ref 0–40)
Albumin/Globulin Ratio: 1.5 (ref 1.2–2.2)
Albumin: 4.1 g/dL (ref 3.8–4.8)
Alkaline Phosphatase: 79 IU/L (ref 44–121)
BUN/Creatinine Ratio: 20 (ref 12–28)
BUN: 14 mg/dL (ref 8–27)
Bilirubin Total: 0.2 mg/dL (ref 0.0–1.2)
CO2: 27 mmol/L (ref 20–29)
Calcium: 9.3 mg/dL (ref 8.7–10.3)
Chloride: 101 mmol/L (ref 96–106)
Creatinine, Ser: 0.71 mg/dL (ref 0.57–1.00)
Globulin, Total: 2.7 g/dL (ref 1.5–4.5)
Glucose: 102 mg/dL — ABNORMAL HIGH (ref 70–99)
Potassium: 3.8 mmol/L (ref 3.5–5.2)
Sodium: 140 mmol/L (ref 134–144)
Total Protein: 6.8 g/dL (ref 6.0–8.5)
eGFR: 90 mL/min/{1.73_m2} (ref >59)

## 2022-07-07 LAB — CBC
Hematocrit: 40.3 % (ref 34.0–46.6)
Hemoglobin: 13.2 g/dL (ref 11.1–15.9)
MCH: 30.6 pg (ref 26.6–33.0)
MCHC: 32.8 g/dL (ref 31.5–35.7)
MCV: 94 fL (ref 79–97)
Platelets: 276 10*3/uL (ref 150–450)
RBC: 4.31 x10E6/uL (ref 3.77–5.28)
RDW: 12.3 % (ref 11.7–15.4)
WBC: 8.3 10*3/uL (ref 3.4–10.8)

## 2022-07-08 DIAGNOSIS — H2513 Age-related nuclear cataract, bilateral: Secondary | ICD-10-CM | POA: Diagnosis not present

## 2022-07-15 ENCOUNTER — Telehealth: Payer: Self-pay

## 2022-07-15 NOTE — Telephone Encounter (Signed)
error 

## 2022-07-20 ENCOUNTER — Ambulatory Visit (INDEPENDENT_AMBULATORY_CARE_PROVIDER_SITE_OTHER): Payer: Medicare HMO | Admitting: Family Medicine

## 2022-07-20 ENCOUNTER — Encounter: Payer: Self-pay | Admitting: Family Medicine

## 2022-07-20 DIAGNOSIS — F411 Generalized anxiety disorder: Secondary | ICD-10-CM | POA: Diagnosis not present

## 2022-07-20 DIAGNOSIS — F5104 Psychophysiologic insomnia: Secondary | ICD-10-CM | POA: Diagnosis not present

## 2022-07-20 MED ORDER — HYDROXYZINE PAMOATE 50 MG PO CAPS
ORAL_CAPSULE | ORAL | 3 refills | Status: DC
Start: 2022-07-20 — End: 2022-08-16

## 2022-07-20 MED ORDER — BUSPIRONE HCL 5 MG PO TABS: ORAL_TABLET | ORAL | 1 refills | Status: DC

## 2022-07-20 NOTE — Progress Notes (Unsigned)
Virtual Visit via Telephone Note  I connected with Orlene Erm on 07/20/22 at 11:40 AM EDT by telephone and verified that I am speaking with the correct person using two identifiers.  Location: Patient: home Provider: office   I discussed the limitations, risks, security and privacy concerns of performing an evaluation and management service by telephone and the availability of in person appointments. I also discussed with the patient that there may be a patient responsible charge related to this service. The patient expressed understanding and agreed to proceed.   History of Present Illness:   c/o uncontrolled anxiety and poor sleep, states still overwhelmed with loss of spouse , and finaciially challenged Though wants to quit smoking feels currently iunable to do this Observations/Objective: Good communication with no confusion and intact memory. Alert and oriented x 3 No signs of respiratory distress during speech   Assessment and Plan: GAD (generalized anxiety disorder) Uncontrolled with persistent insomnia, increase to buspar 4 daily  Insomnia Sleep hygiene reviewed and written information offered also. Prescription sent for  medication needed.    Follow Up Instructions:    I discussed the assessment and treatment plan with the patient. The patient was provided an opportunity to ask questions and all were answered. The patient agreed with the plan and demonstrated an understanding of the instructions.   The patient was advised to call back or seek an in-person evaluation if the symptoms worsen or if the condition fails to improve as anticipated.  I provided 10 minutes of non-face-to-face time during this encounter.   Tula Nakayama, MD

## 2022-07-20 NOTE — Patient Instructions (Signed)
Please reschedule f/u to first week in December on a WEdnesday, call if you need me sooner  Dose increase in BuSpar to 1 tablet twice daily and 2 at bedtime.  Dose increasing hydroxyzine to 50 mg at bedtime.  Please call back and let us know when you want your mammogram to be scheduled for.  Continue to work on getting your colonoscopy done as this is important.  I know that you will you are just working through the general stress of your changes life still.  Work on quitting smoking  Thanks for choosing [facility], we consider it a privelige to serve you.

## 2022-07-23 ENCOUNTER — Encounter: Payer: Self-pay | Admitting: Family Medicine

## 2022-07-25 ENCOUNTER — Encounter: Payer: Self-pay | Admitting: Family Medicine

## 2022-07-25 DIAGNOSIS — G4700 Insomnia, unspecified: Secondary | ICD-10-CM | POA: Insufficient documentation

## 2022-07-25 NOTE — Assessment & Plan Note (Signed)
Uncontrolled with persistent insomnia, increase to buspar 4 daily

## 2022-07-25 NOTE — Assessment & Plan Note (Signed)
Sleep hygiene reviewed and written information offered also. Prescription sent for  medication needed.  

## 2022-07-27 ENCOUNTER — Ambulatory Visit: Payer: Medicare HMO | Admitting: Family Medicine

## 2022-07-29 ENCOUNTER — Telehealth (INDEPENDENT_AMBULATORY_CARE_PROVIDER_SITE_OTHER): Payer: Self-pay | Admitting: *Deleted

## 2022-07-29 NOTE — Telephone Encounter (Signed)
Schedule called patient on 04/20/22 & 04/28/22 and LVM and mailed letter to patient on 06/16/22 asking patient to call to schedule TCS  Referring MD/PCP: simpson  Procedure: tcs  Reason/Indication:  hx polyps  Has patient had this procedure before?  Yes, 2016  If so, when, by whom and where?    Is there a family history of colon cancer?  no  Who?  What age when diagnosed?    Is patient diabetic? If yes, Type 1 or Type 2   no      Does patient have prosthetic heart valve or mechanical valve?  no  Do you have a pacemaker/defibrillator?  no  Has patient ever had endocarditis/atrial fibrillation? no  Does patient use oxygen? no  Has patient had joint replacement within last 12 months?  no  Is patient constipated or do they take laxatives? no  Does patient have a history of alcohol/drug use?  no  Have you had a stroke/heart attack last 6 mths? no  Do you take medicine for weight loss?  no  For female patients,: have you had a hysterectomy yes                      are you post menopausal no                      do you still have your menstrual cycle no  Is patient on blood thinner such as Coumadin, Plavix and/or Aspirin? no  Medications: simvsatatin 40 mg daily, losartan/hctz 100/25 mg daily, hydroxyzine 25 mg daily, buspirone 5 mg tid daily, centrum silver daily, iron 65 mg daily  Allergies: nkda  Ph[facility]acy: [facility]  Medication Adjustment per Dr Rehman/Dr Jenetta Downer   Procedure date & time:

## 2022-08-02 ENCOUNTER — Telehealth: Payer: Self-pay | Admitting: Family Medicine

## 2022-08-02 ENCOUNTER — Other Ambulatory Visit: Payer: Self-pay

## 2022-08-02 MED ORDER — SIMVASTATIN 40 MG PO TABS: 40.0000 mg | ORAL_TABLET | Freq: Every day | ORAL | 3 refills | Status: DC

## 2022-08-02 NOTE — Telephone Encounter (Signed)
Refills sent

## 2022-08-02 NOTE — Telephone Encounter (Signed)
Patient needs a refill on   simvastatin (ZOCOR) 40 MG tablet    [id number] [place] [facility] [address] [place] [address]

## 2022-08-03 ENCOUNTER — Telehealth: Payer: Self-pay | Admitting: Family Medicine

## 2022-08-03 ENCOUNTER — Other Ambulatory Visit: Payer: Self-pay

## 2022-08-03 MED ORDER — SIMVASTATIN 40 MG PO TABS: 40.0000 mg | ORAL_TABLET | Freq: Every day | ORAL | 3 refills | Status: DC

## 2022-08-03 NOTE — Telephone Encounter (Signed)
Refills sent

## 2022-08-03 NOTE — Telephone Encounter (Signed)
Patient called need med refill, only has 4 pills left and need more.  simvastatin (ZOCOR) 40 MG tablet [[id number]]   Pharmacy  [id number] [place] [id number]: [phone] Fax: [phone] #: [id number]

## 2022-08-13 ENCOUNTER — Other Ambulatory Visit: Payer: Self-pay | Admitting: Family Medicine

## 2022-09-01 ENCOUNTER — Encounter: Payer: Self-pay | Admitting: Family Medicine

## 2022-09-01 ENCOUNTER — Ambulatory Visit (INDEPENDENT_AMBULATORY_CARE_PROVIDER_SITE_OTHER): Payer: Medicare HMO | Admitting: Family Medicine

## 2022-09-01 VITALS — BP 124/78 | HR 91 | Ht 65.0 in | Wt 182.0 lb

## 2022-09-01 DIAGNOSIS — E66811 Obesity, class 1: Secondary | ICD-10-CM

## 2022-09-01 DIAGNOSIS — F17219 Nicotine dependence, cigarettes, with unspecified nicotine-induced disorders: Secondary | ICD-10-CM

## 2022-09-01 DIAGNOSIS — R7302 Impaired glucose tolerance (oral): Secondary | ICD-10-CM

## 2022-09-01 DIAGNOSIS — D126 Benign neoplasm of colon, unspecified: Secondary | ICD-10-CM

## 2022-09-01 DIAGNOSIS — E782 Mixed hyperlipidemia: Secondary | ICD-10-CM | POA: Diagnosis not present

## 2022-09-01 DIAGNOSIS — E669 Obesity, unspecified: Secondary | ICD-10-CM | POA: Diagnosis not present

## 2022-09-01 DIAGNOSIS — I10 Essential (primary) hypertension: Secondary | ICD-10-CM

## 2022-09-01 NOTE — Patient Instructions (Addendum)
Annual exam June 7, or after, call if you need me sooner  HBA1C, lipid today  Continue to cut back on cigarettes  Continue to move forward with faith  PLEASE get mammogram and colonoscopy  Fasting CBC, lipid, cmp and EGFR, tSH, hBA1C and vit D 1 week before June appt

## 2022-09-02 ENCOUNTER — Encounter: Payer: Self-pay | Admitting: Family Medicine

## 2022-09-02 LAB — HEMOGLOBIN A1C
Est. average glucose Bld gHb Est-mCnc: 126 mg/dL
Hgb A1c MFr Bld: 6 % — ABNORMAL HIGH (ref 4.8–5.6)

## 2022-09-02 LAB — LIPID PANEL
Chol/HDL Ratio: 2.5 ratio (ref 0.0–4.4)
Cholesterol, Total: 200 mg/dL — ABNORMAL HIGH (ref 100–199)
HDL: 79 mg/dL (ref >39)
LDL Chol Calc (NIH): 109 mg/dL — ABNORMAL HIGH (ref 0–99)
Triglycerides: 65 mg/dL (ref 0–149)
VLDL Cholesterol Cal: 12 mg/dL (ref 5–40)

## 2022-09-03 ENCOUNTER — Encounter: Payer: Self-pay | Admitting: Family Medicine

## 2022-09-03 NOTE — Progress Notes (Signed)
Amanda Simon     MRN: [id number]      DOB: 03-02-1951   HPI [name] is here for follow up and re-evaluation of chronic medical conditions, medication management and review of any available recent lab and radiology data.  Preventive health is updated, specifically  Cancer screening and Immunization.   Questions or concerns regarding consultations or procedures which the PT has had in the interim are  addressed. The PT denies any adverse reactions to current medications since the last visit.  There are no new concerns.  There are no specific complaints   ROS Denies recent fever or chills. Denies sinus pressure, nasal congestion, ear pain or sore throat. Denies chest congestion, productive cough or wheezing. Denies chest pains, palpitations and leg swelling Denies abdominal pain, nausea, vomiting,diarrhea or constipation.   Denies dysuria, frequency, hesitancy or incontinence. Denies joint pain, swelling and limitation in mobility. Denies headaches, seizures, numbness, or tingling. Denies  uncontrolled depression, anxiety or insomnia.Grief is improving Denies skin break down or rash.   PE  BP 124/78 (BP Location: Left Arm, Patient Position: Sitting, Cuff Size: Normal)   Pulse 91   Ht '5\' 5"'$  (1.651 m)   Wt 182 lb (82.6 kg)   SpO2 95%   BMI 30.29 kg/m   Patient alert and oriented and in no cardiopulmonary distress.  HEENT: No facial asymmetry, EOMI,     Neck supple .  Chest: Clear to auscultation bilaterally.  CVS: S1, S2 no murmurs, no S3.Regular rate.  ABD: Soft non tender.   Ext: No edema  MS: decreased ROM spine, shoulders, hips and knees.Use cane for stability  Skin: Intact, no ulcerations or rash noted.  Psych: Good eye contact, normal affect. Memory intact not anxious or depressed appearing.  CNS: CN 2-12 intact, power,  normal throughout.no focal deficits noted.   Assessment & Plan  Essential hypertension Controlled, no change in medication DASH  diet and commitment to daily physical activity for a minimum of 30 minutes discussed and encouraged, as a part of hypertension management. The importance of attaining a healthy weight is also discussed.     09/01/2022    2:01 PM 09/01/2022    1:59 PM 07/05/2022    3:53 PM 07/05/2022    3:52 PM 07/05/2022    3:51 PM 07/05/2022    3:43 PM 02/25/2022   10:52 AM  BP/Weight  Systolic BP 502 774 128 786 767 209 470  Diastolic BP 78 75 72 68 67 72 71  Wt. (Lbs)  182     177.12  BMI  30.29 kg/m2     29.47 kg/m2       Cigarette nicotine dependence with nicotine-induced disorder Asked:confirms currently smokes cigarettes Assess: Unwilling to set a quit date, but is cutting back Advise: needs to QUIT to reduce risk of cancer, cardio and cerebrovascular disease Assist: counseled for 5 minutes and literature provided Arrange: follow up in 2 to 4 months   Hyperlipemia Hyperlipidemia:Low fat diet discussed and encouraged.   Lipid Panel  Lab Results  Component Value Date   CHOL 200 (H) 09/01/2022   HDL 79 09/01/2022   LDLCALC 109 (H) 09/01/2022   TRIG 65 09/01/2022   CHOLHDL 2.5 09/01/2022     Needs to reduce fat in diet , no med change  Obesity (BMI 30.0-34.9)  Patient re-educated about  the importance of commitment to a  minimum of 150 minutes of exercise per week as able.  The importance of healthy food choices  with portion control discussed, as well as eating regularly and within a 12 hour window most days. The need to choose "clean , green" food 50 to 75% of the time is discussed, as well as to make water the primary drink and set a goal of 64 ounces water daily.       09/01/2022    1:59 PM 02/25/2022   10:52 AM 06/24/2021    8:26 AM  Weight /BMI  Weight 182 lb 177 lb 1.9 oz 198 lb 1.9 oz  Height '5\' 5"'$  (1.651 m) '5\' 5"'$  (1.651 m) '5\' 6"'$  (1.676 m)  BMI 30.29 kg/m2 29.47 kg/m2 31.98 kg/m2      IGT (impaired glucose tolerance) Patient educated about the importance of  limiting  Carbohydrate intake , the need to commit to daily physical activity for a minimum of 30 minutes , and to commit weight loss. The fact that changes in all these areas will reduce or eliminate all together the development of diabetes is stressed.  Deteriorated, needs to  reduce  sweets and carbs     Latest Ref Rng & Units 09/01/2022    2:41 PM 07/05/2022    4:22 PM 02/25/2022    1:06 PM 06/24/2021   10:14 AM 02/05/2021    1:09 PM  Diabetic Labs  HbA1c 4.8 - 5.6 % 6.0   5.9  6.3  6.2   Chol 100 - 199 mg/dL 200   188  185  189   HDL >39 mg/dL 79   61  59  57   Calc LDL 0 - 99 mg/dL 109   114  113  118   Triglycerides 0 - 149 mg/dL 65   68  70  78   Creatinine 0.57 - 1.00 mg/dL  0.71  0.69  0.62  0.68       09/01/2022    2:01 PM 09/01/2022    1:59 PM 07/05/2022    3:53 PM 07/05/2022    3:52 PM 07/05/2022    3:51 PM 07/05/2022    3:43 PM 02/25/2022   10:52 AM  BP/Weight  Systolic BP 150 569 794 801 655 374 827  Diastolic BP 78 75 72 68 67 72 71  Wt. (Lbs)  182     177.12  BMI  30.29 kg/m2     29.47 kg/m2       No data to display            Tubular adenoma of colon Needs rept colonoscopy, [plans to  get this in next 6 months

## 2022-09-03 NOTE — Assessment & Plan Note (Signed)
Controlled, no change in medication DASH diet and commitment to daily physical activity for a minimum of 30 minutes discussed and encouraged, as a part of hypertension management. The importance of attaining a healthy weight is also discussed.     09/01/2022    2:01 PM 09/01/2022    1:59 PM 07/05/2022    3:53 PM 07/05/2022    3:52 PM 07/05/2022    3:51 PM 07/05/2022    3:43 PM 02/25/2022   10:52 AM  BP/Weight  Systolic BP 829 937 169 678 938 101 751  Diastolic BP 78 75 72 68 67 72 71  Wt. (Lbs)  182     177.12  BMI  30.29 kg/m2     29.47 kg/m2

## 2022-09-03 NOTE — Assessment & Plan Note (Signed)
Patient educated about the importance of limiting  Carbohydrate intake , the need to commit to daily physical activity for a minimum of 30 minutes , and to commit weight loss. The fact that changes in all these areas will reduce or eliminate all together the development of diabetes is stressed.  Deteriorated, needs to  reduce  sweets and carbs     Latest Ref Rng & Units 09/01/2022    2:41 PM 07/05/2022    4:22 PM 02/25/2022    1:06 PM 06/24/2021   10:14 AM 02/05/2021    1:09 PM  Diabetic Labs  HbA1c 4.8 - 5.6 % 6.0   5.9  6.3  6.2   Chol 100 - 199 mg/dL 200   188  185  189   HDL >39 mg/dL 79   61  59  57   Calc LDL 0 - 99 mg/dL 109   114  113  118   Triglycerides 0 - 149 mg/dL 65   68  70  78   Creatinine 0.57 - 1.00 mg/dL  0.71  0.69  0.62  0.68       09/01/2022    2:01 PM 09/01/2022    1:59 PM 07/05/2022    3:53 PM 07/05/2022    3:52 PM 07/05/2022    3:51 PM 07/05/2022    3:43 PM 02/25/2022   10:52 AM  BP/Weight  Systolic BP 546 270 350 093 818 299 371  Diastolic BP 78 75 72 68 67 72 71  Wt. (Lbs)  182     177.12  BMI  30.29 kg/m2     29.47 kg/m2       No data to display

## 2022-09-03 NOTE — Assessment & Plan Note (Signed)
Asked:confirms currently smokes cigarettes °Assess: Unwilling to set a quit date, but is cutting back °Advise: needs to QUIT to reduce risk of cancer, cardio and cerebrovascular disease °Assist: counseled for 5 minutes and literature provided °Arrange: follow up in 2 to 4 months ° °

## 2022-09-03 NOTE — Progress Notes (Incomplete)
   Amanda Simon     MRN: [id number]      DOB: June 28, 1951   HPI Amanda Simon is here for follow up and re-evaluation of chronic medical conditions, medication management and review of any available recent lab and radiology data.  Preventive health is updated, specifically  Cancer screening and Immunization.   Questions or concerns regarding consultations or procedures which the PT has had in the interim are  addressed. The PT denies any adverse reactions to current medications since the last visit.  There are no new concerns.  There are no specific complaints   ROS Denies recent fever or chills. Denies sinus pressure, nasal congestion, ear pain or sore throat. Denies chest congestion, productive cough or wheezing. Denies chest pains, palpitations and leg swelling Denies abdominal pain, nausea, vomiting,diarrhea or constipation.   Denies dysuria, frequency, hesitancy or incontinence. Denies joint pain, swelling and limitation in mobility. Denies headaches, seizures, numbness, or tingling. Denies depression, anxiety or insomnia. Denies skin break down or rash.   PE  BP 124/78 (BP Location: Left Arm, Patient Position: Sitting, Cuff Size: Normal)   Pulse 91   Ht '5\' 5"'$  (1.651 m)   Wt 182 lb (82.6 kg)   SpO2 95%   BMI 30.29 kg/m   Patient alert and oriented and in no cardiopulmonary distress.  HEENT: No facial asymmetry, EOMI,     Neck supple .  Chest: Clear to auscultation bilaterally.  CVS: S1, S2 no murmurs, no S3.Regular rate.  ABD: Soft non tender.   Ext: No edema  MS: Adequate ROM spine, shoulders, hips and knees.  Skin: Intact, no ulcerations or rash noted.  Psych: Good eye contact, normal affect. Memory intact not anxious or depressed appearing.  CNS: CN 2-12 intact, power,  normal throughout.no focal deficits noted.   Assessment & Plan  ***

## 2022-09-03 NOTE — Assessment & Plan Note (Signed)
Hyperlipidemia:Low fat diet discussed and encouraged.   Lipid Panel  Lab Results  Component Value Date   CHOL 200 (H) 09/01/2022   HDL 79 09/01/2022   LDLCALC 109 (H) 09/01/2022   TRIG 65 09/01/2022   CHOLHDL 2.5 09/01/2022     Needs to reduce fat in diet , no med change

## 2022-09-03 NOTE — Assessment & Plan Note (Signed)
  Patient re-educated about  the importance of commitment to a  minimum of 150 minutes of exercise per week as able.  The importance of healthy food choices with portion control discussed, as well as eating regularly and within a 12 hour window most days. The need to choose "clean , green" food 50 to 75% of the time is discussed, as well as to make water the primary drink and set a goal of 64 ounces water daily.       09/01/2022    1:59 PM 02/25/2022   10:52 AM 06/24/2021    8:26 AM  Weight /BMI  Weight 182 lb 177 lb 1.9 oz 198 lb 1.9 oz  Height '5\' 5"'$  (1.651 m) '5\' 5"'$  (1.651 m) '5\' 6"'$  (1.676 m)  BMI 30.29 kg/m2 29.47 kg/m2 31.98 kg/m2

## 2022-09-03 NOTE — Assessment & Plan Note (Signed)
Needs rept colonoscopy, [plans to  get this in next 6 months

## 2022-11-12 ENCOUNTER — Telehealth: Payer: Self-pay | Admitting: Family Medicine

## 2022-11-12 NOTE — Telephone Encounter (Signed)
Patient called needs the cologard instead of the colonoscopy. Plesae call patient at [phone].

## 2022-11-12 NOTE — Telephone Encounter (Signed)
Advised she has had polyps in past and wasn't a candidate for cologuard but I would see if she could do the yearly fit test

## 2022-11-15 NOTE — Telephone Encounter (Signed)
Number busy

## 2022-11-30 DIAGNOSIS — H524 Presbyopia: Secondary | ICD-10-CM | POA: Diagnosis not present

## 2022-12-26 ENCOUNTER — Other Ambulatory Visit: Payer: Self-pay | Admitting: Family Medicine

## 2023-01-24 ENCOUNTER — Other Ambulatory Visit: Payer: Self-pay | Admitting: Family Medicine

## 2023-02-15 ENCOUNTER — Ambulatory Visit: Payer: Medicare HMO

## 2023-03-09 ENCOUNTER — Encounter: Payer: Self-pay | Admitting: Family Medicine

## 2023-03-09 ENCOUNTER — Ambulatory Visit (INDEPENDENT_AMBULATORY_CARE_PROVIDER_SITE_OTHER): Payer: Medicare HMO | Admitting: Family Medicine

## 2023-03-09 VITALS — BP 122/74 | HR 75 | Ht 65.0 in | Wt 189.0 lb

## 2023-03-09 DIAGNOSIS — D126 Benign neoplasm of colon, unspecified: Secondary | ICD-10-CM

## 2023-03-09 DIAGNOSIS — R3912 Poor urinary stream: Secondary | ICD-10-CM | POA: Insufficient documentation

## 2023-03-09 DIAGNOSIS — Z1231 Encounter for screening mammogram for malignant neoplasm of breast: Secondary | ICD-10-CM | POA: Diagnosis not present

## 2023-03-09 DIAGNOSIS — M549 Dorsalgia, unspecified: Secondary | ICD-10-CM | POA: Insufficient documentation

## 2023-03-09 DIAGNOSIS — R339 Retention of urine, unspecified: Secondary | ICD-10-CM

## 2023-03-09 DIAGNOSIS — I10 Essential (primary) hypertension: Secondary | ICD-10-CM | POA: Diagnosis not present

## 2023-03-09 DIAGNOSIS — E782 Mixed hyperlipidemia: Secondary | ICD-10-CM | POA: Diagnosis not present

## 2023-03-09 DIAGNOSIS — E559 Vitamin D deficiency, unspecified: Secondary | ICD-10-CM

## 2023-03-09 DIAGNOSIS — R7302 Impaired glucose tolerance (oral): Secondary | ICD-10-CM | POA: Diagnosis not present

## 2023-03-09 DIAGNOSIS — G8929 Other chronic pain: Secondary | ICD-10-CM

## 2023-03-09 DIAGNOSIS — F17219 Nicotine dependence, cigarettes, with unspecified nicotine-induced disorders: Secondary | ICD-10-CM

## 2023-03-09 DIAGNOSIS — Z0001 Encounter for general adult medical examination with abnormal findings: Secondary | ICD-10-CM | POA: Diagnosis not present

## 2023-03-09 MED ORDER — GABAPENTIN 100 MG PO CAPS: 100.0000 mg | ORAL_CAPSULE | Freq: Three times a day (TID) | ORAL | 5 refills | Status: DC

## 2023-03-09 NOTE — Assessment & Plan Note (Signed)
Reports increased painb andstiffnes of low back and RLE, needs thinks tylenol not beneficial will start gabapentin low dose at bedtime, encouraged weight loss and increased exercise

## 2023-03-09 NOTE — Assessment & Plan Note (Signed)
Patient educated about the importance of limiting  Carbohydrate intake , the need to commit to daily physical activity for a minimum of 30 minutes , and to commit weight loss. The fact that changes in all these areas will reduce or eliminate all together the development of diabetes is stressed.      Latest Ref Rng & Units 09/01/2022    2:41 PM 07/05/2022    4:22 PM 02/25/2022    1:06 PM 06/24/2021   10:14 AM 02/05/2021    1:09 PM  Diabetic Labs  HbA1c 4.8 - 5.6 % 6.0   5.9  6.3  6.2   Chol 100 - 199 mg/dL 914   782  956  213   HDL >39 mg/dL 79   61  59  57   Calc LDL 0 - 99 mg/dL 086   578  469  629   Triglycerides 0 - 149 mg/dL 65   68  70  78   Creatinine 0.57 - 1.00 mg/dL  5.28  4.13  2.44  0.10       03/09/2023   10:56 AM 09/01/2022    2:01 PM 09/01/2022    1:59 PM 07/05/2022    3:53 PM 07/05/2022    3:52 PM 07/05/2022    3:51 PM 07/05/2022    3:43 PM  BP/Weight  Systolic BP 122 124 154 125 115 111 114  Diastolic BP 74 78 75 72 68 67 72  Wt. (Lbs) 189  182      BMI 31.45 kg/m2  30.29 kg/m2           No data to display          Updated lab needed.

## 2023-03-09 NOTE — Progress Notes (Signed)
    Amanda Simon     MRN: [id number]      DOB: 1951/06/22  Chief Complaint  Patient presents with   Annual Exam    CPE  Larey Seat when stepping off curb in 12/2022  injureed right foot, but feels fully recovered , no cuts or bruising  Incomplete emptying and poor stream x 4 months, not incontinence  HPI: Patient is in for annual physical exam. No other health concerns are expressed or addressed at the visit. Recent labs,  are reviewed. Immunization is reviewed , and  updated if needed.   PE: Pleasant  female, alert and oriented x 3, in no cardio-pulmonary distress. Afebrile. HEENT No facial trauma or asymetry. Sinuses non tender.  Extra occullar muscles intact.. External ears normal, . Neck: supple, no adenopathy,JVD or thyromegaly.No bruits.  Chest: Clear to ascultation bilaterally.No crackles or wheezes. Non tender to palpation  Breast: No asymetry,no masses or lumps. No tenderness. No nipple discharge or inversion. No axillary or supraclavicular adenopathy  Cardiovascular system; Heart sounds normal,  S1 and  S2 ,no S3.  No murmur, or thrill. Apical beat not displaced Peripheral pulses normal.  Abdomen: Soft, non tender, no organomegaly or masses. No bruits. Bowel sounds normal. No guarding, tenderness or rebound.   GU: External genitalia normal female genitalia , normal female distribution of hair. No lesions. Urethral meatus normal in size, no  Prolapse, no lesions visibly  Present. Bladder non tender. Vagina pink and moist , with no visible lesions , discharge present . Adequate pelvic support no  cystocele or rectocele noted Cervix pink and appears healthy, no lesions or ulcerations noted, no discharge noted from os Uterus normal size, no adnexal masses, no cervical motion or adnexal tenderness.   Musculoskeletal exam: Full ROM of spine, hips , shoulders and knees. No deformity ,swelling or crepitus noted. No muscle wasting or atrophy.    Neurologic: Cranial nerves 2 to 12 intact. Power, tone ,sensation and reflexes normal throughout. No disturbance in gait. No tremor.  Skin: Intact, no ulceration, erythema , scaling or rash noted. Pigmentation normal throughout  Psych; Normal mood and affect. Judgement and concentration normal   Assessment & Plan:  No problem-specific Assessment & Plan notes found for this encounter.

## 2023-03-09 NOTE — Progress Notes (Signed)
Amanda Simon     MRN: [id number]      DOB: Jan 08, 1951  Chief Complaint  Patient presents with   Annual Exam    CPE    HPI: Patient is in for annual physical exam. C/o poor urinary stream and incomplete emptying of bladder x 4 months. Labs today Cancer screening past due Immunization is reviewed , and  needs TDaP PE: BP 122/74 (BP Location: Right Arm, Patient Position: Sitting, Cuff Size: Large)   Pulse 75   Ht 5\' 5"  (1.651 m)   Wt 189 lb (85.7 kg)   SpO2 95%   BMI 31.45 kg/m   Pleasant  female, alert and oriented x 3, in no cardio-pulmonary distress. Afebrile. HEENT No facial trauma or asymetry. Sinuses non tender.  Extra occullar muscles intact.. External ears normal, . Neck: supple, no adenopathy,JVD or thyromegaly.No bruits.  Chest: Clear to ascultation bilaterally.No crackles or wheezes. Non tender to palpation   Cardiovascular system; Heart sounds normal,  S1 and  S2 ,no S3.  No murmur, or thrill. Apical beat not displaced Peripheral pulses normal.  Abdomen: Soft, non tender, no organomegaly or masses. No bruits. Bowel sounds normal. No guarding, tenderness or rebound.      Musculoskeletal exam: Decreased ROM of spine, adequate in hips , shoulders and reduced in  knees. No deformity ,swelling or crepitus noted. No muscle wasting or atrophy.   Neurologic: Cranial nerves 2 to 12 intact. Power, tone ,sensation  normal throughout. No disturbance in gait. No tremor.  Skin: Intact, no ulceration, erythema , scaling or rash noted. Pigmentation normal throughout  Psych; Normal mood and affect. Judgement and concentration normal   Assessment & Plan:  Encounter for Medicare annual examination with abnormal findings Annual exam as documented. Counseling done  re healthy lifestyle involving commitment to 150 minutes exercise per week, heart healthy diet, and attaining healthy weight.The importance of adequate sleep also discussed. Regular  seat belt use and home safety, is also discussed. Changes in health habits are decided on by the patient with goals and time frames  set for achieving them. Immunization and cancer screening needs are specifically addressed at this visit.   Cigarette nicotine dependence with nicotine-induced disorder Asked:confirms currently smokes cigarettes 2 to 5 / day Assess: Unwilling to set a quit date, but is cutting back Advise: needs to QUIT to reduce risk of cancer, cardio and cerebrovascular disease Assist: counseled for 5 minutes and literature provided Arrange: follow up in 2 to 4 months   Vitamin D deficiency Updated lab needed at/ before next visit.   Tubular adenoma of colon Needs colonoscopy, denies change in BM, states goes infrequently as eats very little  IGT (impaired glucose tolerance) Patient educated about the importance of limiting  Carbohydrate intake , the need to commit to daily physical activity for a minimum of 30 minutes , and to commit weight loss. The fact that changes in all these areas will reduce or eliminate all together the development of diabetes is stressed.      Latest Ref Rng & Units 09/01/2022    2:41 PM 07/05/2022    4:22 PM 02/25/2022    1:06 PM 06/24/2021   10:14 AM 02/05/2021    1:09 PM  Diabetic Labs  HbA1c 4.8 - 5.6 % 6.0   5.9  6.3  6.2   Chol 100 - 199 mg/dL 782   956  213  086   HDL >39 mg/dL 79   61  59  57   Calc LDL 0 - 99 mg/dL 161   096  045  409   Triglycerides 0 - 149 mg/dL 65   68  70  78   Creatinine 0.57 - 1.00 mg/dL  8.11  9.14  7.82  9.56       03/09/2023   10:56 AM 09/01/2022    2:01 PM 09/01/2022    1:59 PM 07/05/2022    3:53 PM 07/05/2022    3:52 PM 07/05/2022    3:51 PM 07/05/2022    3:43 PM  BP/Weight  Systolic BP 122 124 154 125 115 111 114  Diastolic BP 74 78 75 72 68 67 72  Wt. (Lbs) 189  182      BMI 31.45 kg/m2  30.29 kg/m2           No data to display          Updated lab needed.   Poor urinary  stream 4 month history, refer urology  Incomplete bladder emptying Refer urology for eval  Back pain Reports increased painb andstiffnes of low back and RLE, needs thinks tylenol not beneficial will start gabapentin low dose at bedtime, encouraged weight loss and increased exercise

## 2023-03-09 NOTE — Assessment & Plan Note (Signed)
Updated lab needed at/ before next visit.   

## 2023-03-09 NOTE — Assessment & Plan Note (Signed)

## 2023-03-09 NOTE — Assessment & Plan Note (Signed)
Refer urology for eval

## 2023-03-09 NOTE — Assessment & Plan Note (Signed)
Asked:confirms currently smokes cigarettes 2 to 5 / day Assess: Unwilling to set a quit date, but is cutting back Advise: needs to QUIT to reduce risk of cancer, cardio and cerebrovascular disease Assist: counseled for 5 minutes and literature provided Arrange: follow up in 2 to 4 months  

## 2023-03-09 NOTE — Assessment & Plan Note (Signed)
4 month history, refer urology

## 2023-03-09 NOTE — Assessment & Plan Note (Signed)
Needs colonoscopy, denies change in BM, states goes infrequently as eats very little

## 2023-03-09 NOTE — Patient Instructions (Addendum)
F/U in early December, call if you need me sooner  Labs today CBC, lipid, cmp and eGFr, hBa1C, TSH and vit d  Please schedule mammogram at checkout for [facility]  Pls help daughter at checkout to log in to My Chart  Need TdAP at your pharmacy, which is due  You are being referred to Urology in [place] regarding urinary symptoms  I will be in touch with you regarding colonoscopy  New for arthritis is gabapentin 100 mg one at bedtime  It is important that you exercise regularly at least 30 minutes 5 times a week. If you develop chest pain, have severe difficulty breathing, or feel very tired, stop exercising immediately and seek medical attention  Thanks for choosing [facility], we consider it a privelige to serve you.

## 2023-03-10 LAB — VITAMIN D 25 HYDROXY (VIT D DEFICIENCY, FRACTURES): Vit D, 25-Hydroxy: 29.6 ng/mL — ABNORMAL LOW (ref 30.0–100.0)

## 2023-03-10 LAB — CMP14+EGFR
ALT: 14 IU/L (ref 0–32)
AST: 20 IU/L (ref 0–40)
Albumin: 4.1 g/dL (ref 3.8–4.8)
Alkaline Phosphatase: 94 IU/L (ref 44–121)
BUN/Creatinine Ratio: 15 (ref 12–28)
BUN: 11 mg/dL (ref 8–27)
Bilirubin Total: 0.6 mg/dL (ref 0.0–1.2)
CO2: 25 mmol/L (ref 20–29)
Calcium: 9.5 mg/dL (ref 8.7–10.3)
Chloride: 104 mmol/L (ref 96–106)
Creatinine, Ser: 0.73 mg/dL (ref 0.57–1.00)
Globulin, Total: 3.1 g/dL (ref 1.5–4.5)
Glucose: 82 mg/dL (ref 70–99)
Potassium: 4.2 mmol/L (ref 3.5–5.2)
Sodium: 143 mmol/L (ref 134–144)
Total Protein: 7.2 g/dL (ref 6.0–8.5)
eGFR: 87 mL/min/{1.73_m2} (ref >59)

## 2023-03-10 LAB — HEMOGLOBIN A1C
Est. average glucose Bld gHb Est-mCnc: 131 mg/dL
Hgb A1c MFr Bld: 6.2 % — ABNORMAL HIGH (ref 4.8–5.6)

## 2023-03-10 LAB — CBC
Hematocrit: 39.3 % (ref 34.0–46.6)
Hemoglobin: 12.7 g/dL (ref 11.1–15.9)
MCH: 29.6 pg (ref 26.6–33.0)
MCHC: 32.3 g/dL (ref 31.5–35.7)
MCV: 92 fL (ref 79–97)
Platelets: 274 10*3/uL (ref 150–450)
RBC: 4.29 x10E6/uL (ref 3.77–5.28)
RDW: 12.2 % (ref 11.7–15.4)
WBC: 8.7 10*3/uL (ref 3.4–10.8)

## 2023-03-10 LAB — LIPID PANEL
Chol/HDL Ratio: 2.7 ratio (ref 0.0–4.4)
Cholesterol, Total: 175 mg/dL (ref 100–199)
HDL: 66 mg/dL (ref >39)
LDL Chol Calc (NIH): 97 mg/dL (ref 0–99)
Triglycerides: 65 mg/dL (ref 0–149)
VLDL Cholesterol Cal: 12 mg/dL (ref 5–40)

## 2023-03-10 LAB — TSH: TSH: 0.497 u[IU]/mL (ref 0.450–4.500)

## 2023-03-14 ENCOUNTER — Telehealth: Payer: Self-pay | Admitting: Family Medicine

## 2023-03-14 NOTE — Telephone Encounter (Signed)
Patient returning lab result called and mention that she needs to be taking metformin. Please return patient call.

## 2023-03-15 ENCOUNTER — Encounter: Payer: Self-pay | Admitting: Family Medicine

## 2023-03-15 NOTE — Telephone Encounter (Signed)
See mychart msg

## 2023-03-15 NOTE — Telephone Encounter (Signed)
Pt called back in regard to previous tele message. Wants a call back.

## 2023-03-23 ENCOUNTER — Encounter: Payer: Self-pay | Admitting: Family Medicine

## 2023-03-25 ENCOUNTER — Encounter: Payer: Self-pay | Admitting: Family Medicine

## 2023-03-28 NOTE — Telephone Encounter (Signed)
Spoke with patient. Let her know that she needs to call both to schedule appointments, she stated her understanding.

## 2023-04-13 ENCOUNTER — Ambulatory Visit (HOSPITAL_COMMUNITY): Payer: Medicare HMO

## 2023-08-07 ENCOUNTER — Other Ambulatory Visit: Payer: Self-pay | Admitting: Family Medicine

## 2023-08-23 ENCOUNTER — Telehealth: Payer: Self-pay

## 2023-08-23 NOTE — Telephone Encounter (Signed)
Patient called requesting antibiotic states she has an infection from dental work please advise ?

## 2023-08-24 ENCOUNTER — Ambulatory Visit: Payer: Medicare HMO | Admitting: Family Medicine

## 2023-08-24 ENCOUNTER — Ambulatory Visit (HOSPITAL_COMMUNITY): Payer: Medicare HMO

## 2023-08-24 NOTE — Telephone Encounter (Signed)
Patient aware.

## 2023-10-11 ENCOUNTER — Other Ambulatory Visit: Payer: Self-pay | Admitting: Family Medicine

## 2023-10-11 MED ORDER — LOSARTAN POTASSIUM-HCTZ 100-25 MG PO TABS: 1.0000 | ORAL_TABLET | Freq: Every day | ORAL | 3 refills | Status: DC

## 2023-10-11 MED ORDER — SIMVASTATIN 40 MG PO TABS: 40.0000 mg | ORAL_TABLET | Freq: Every day | ORAL | 3 refills | Status: DC

## 2023-10-11 NOTE — Telephone Encounter (Signed)
Copied from CRM [id number]. Topic: Clinical - Medication Refill >> Oct 11, 2023  2:53 PM Rona Ravens wrote: Most Recent Primary Care Visit:  Provider: Syliva Overman E  Department: [facility]  Visit Type: PHYSICAL  Date: 03/09/2023  Medication: simvastatin (ZOCOR) 40 MG tablet & losartan-hydrochlorothiazide (HYZAAR) 100-25 MG tablet  Has the patient contacted their pharmacy? Yes (Agent: If no, request that the patient contact the pharmacy for the refill. If patient does not wish to contact the pharmacy document the reason why and proceed with request.) (Agent: If yes, when and what did the pharmacy advise?)  Is this the correct pharmacy for this prescription? Yes If no, delete pharmacy and type the correct one.  This is the patient's preferred pharmacy:  [facility] [address], [place] - [address] [place] [address] Phone: [phone] Fax: [phone]   Has the prescription been filled recently? Yes  Is the patient out of the medication? Yes  Has the patient been seen for an appointment in the last year OR does the patient have an upcoming appointment? Yes  Can we respond through MyChart? Yes  Agent: Please be advised that Rx refills may take up to 3 business days. We ask that you follow-up with your pharmacy.

## 2023-12-28 ENCOUNTER — Ambulatory Visit (INDEPENDENT_AMBULATORY_CARE_PROVIDER_SITE_OTHER): Payer: Self-pay | Admitting: Family Medicine

## 2023-12-28 VITALS — BP 129/78 | HR 78 | Resp 16 | Ht 65.0 in | Wt 189.1 lb

## 2023-12-28 DIAGNOSIS — E66811 Obesity, class 1: Secondary | ICD-10-CM

## 2023-12-28 DIAGNOSIS — E782 Mixed hyperlipidemia: Secondary | ICD-10-CM | POA: Diagnosis not present

## 2023-12-28 DIAGNOSIS — D126 Benign neoplasm of colon, unspecified: Secondary | ICD-10-CM | POA: Diagnosis not present

## 2023-12-28 DIAGNOSIS — I10 Essential (primary) hypertension: Secondary | ICD-10-CM | POA: Diagnosis not present

## 2023-12-28 DIAGNOSIS — M549 Dorsalgia, unspecified: Secondary | ICD-10-CM | POA: Diagnosis not present

## 2023-12-28 DIAGNOSIS — F17219 Nicotine dependence, cigarettes, with unspecified nicotine-induced disorders: Secondary | ICD-10-CM | POA: Diagnosis not present

## 2023-12-28 DIAGNOSIS — G8929 Other chronic pain: Secondary | ICD-10-CM

## 2023-12-28 DIAGNOSIS — Z1231 Encounter for screening mammogram for malignant neoplasm of breast: Secondary | ICD-10-CM

## 2023-12-28 DIAGNOSIS — E559 Vitamin D deficiency, unspecified: Secondary | ICD-10-CM | POA: Diagnosis not present

## 2023-12-28 MED ORDER — LOSARTAN POTASSIUM-HCTZ 100-25 MG PO TABS: 1.0000 | ORAL_TABLET | Freq: Every day | ORAL | 3 refills | Status: DC

## 2023-12-28 MED ORDER — GABAPENTIN 100 MG PO CAPS
100.0000 mg | ORAL_CAPSULE | Freq: Three times a day (TID) | ORAL | 5 refills | Status: AC
Start: 2023-12-28 — End: ?

## 2023-12-28 MED ORDER — SIMVASTATIN 40 MG PO TABS: 40.0000 mg | ORAL_TABLET | Freq: Every day | ORAL | 3 refills | Status: DC

## 2023-12-28 NOTE — Patient Instructions (Signed)
 Annual exam mid September, Wednesday only, early morning  around 10 ::30 appointment please ptr wiill get labs drawn on that day before she sees MD or just shortly after  Please schedule mammogram at checkout  Meds are refilled  Nurse pls order fasting CBC, lipid, cmp and EGFr, TSH, hBA1C and vit D the same day she comes back in the Fall  Continue to plan and stop smoking  Thanks for choosing [facility], we consider it a privelige to serve you.

## 2023-12-30 ENCOUNTER — Encounter: Payer: Self-pay | Admitting: Family Medicine

## 2023-12-30 NOTE — Assessment & Plan Note (Signed)
 Hyperlipidemia:Low fat diet discussed and encouraged.   Lipid Panel  Lab Results  Component Value Date   CHOL 175 03/09/2023   HDL 66 03/09/2023   LDLCALC 97 03/09/2023   TRIG 65 03/09/2023   CHOLHDL 2.7 03/09/2023     Updated lab needed at/ before next visit.

## 2023-12-30 NOTE — Assessment & Plan Note (Signed)
 Updated lab needed at/ before next visit.

## 2023-12-30 NOTE — Progress Notes (Signed)
 Amanda Simon     MRN: [id number]      DOB: 10/25/1950  Chief Complaint  Patient presents with   Hypertension    Follow up    HPI Amanda Simon is here for follow up and re-evaluation of chronic medical conditions, medication management and review of any available recent lab and radiology data.  Preventive health is updated, specifically  Cancer screening and Immunization.   Questions or concerns regarding consultations or procedures which the PT has had in the interim are  addressed. The PT denies any adverse reactions to current medications since the last visit.  There are no new concerns.  Requests medication for vertigo There are no specific complaints   ROS Denies recent fever or chills. Denies sinus pressure, nasal congestion, ear pain or sore throat. Denies chest congestion, productive cough or wheezing. Denies chest pains, palpitations and leg swelling Denies abdominal pain, nausea, vomiting,diarrhea or constipation.   Denies dysuria, frequency, hesitancy or incontinence. Denies joint pain, swelling and limitation in mobility. Denies headaches, seizures, numbness, or tingling. Denies depression, anxiety or insomnia. Denies skin break down or rash.   PE  BP 129/78   Pulse 78   Resp 16   Ht 5\' 5"  (1.651 m)   Wt 189 lb 1.3 oz (85.8 kg)   SpO2 94%   BMI 31.46 kg/m   Patient alert and oriented and in no cardiopulmonary distress.  HEENT: No facial asymmetry, EOMI,     Neck supple .  Chest: Clear to auscultation bilaterally.  CVS: S1, S2 no murmurs, no S3.Regular rate.  ABD: Soft non tender.   Ext: No edema  MS: Adequate ROM spine, shoulders, hips and knees.  Skin: Intact, no ulcerations or rash noted.  Psych: Good eye contact, normal affect. Memory intact not anxious or depressed appearing.  CNS: CN 2-12 intact, power,  normal throughout.no focal deficits noted.   Assessment & Plan  Essential hypertension Controlled, no change in medication DASH  diet and commitment to daily physical activity for a minimum of 30 minutes discussed and encouraged, as a part of hypertension management. The importance of attaining a healthy weight is also discussed.     12/28/2023   10:44 AM 03/09/2023   10:56 AM 09/01/2022    2:01 PM 09/01/2022    1:59 PM 07/05/2022    3:53 PM 07/05/2022    3:52 PM 07/05/2022    3:51 PM  BP/Weight  Systolic BP 129 122 124 154 125 115 111  Diastolic BP 78 74 78 75 72 68 67  Wt. (Lbs) 189.08 189  182     BMI 31.46 kg/m2 31.45 kg/m2  30.29 kg/m2          Hyperlipemia Hyperlipidemia:Low fat diet discussed and encouraged.   Lipid Panel  Lab Results  Component Value Date   CHOL 175 03/09/2023   HDL 66 03/09/2023   LDLCALC 97 03/09/2023   TRIG 65 03/09/2023   CHOLHDL 2.7 03/09/2023     Updated lab needed at/ before next visit.   Cigarette nicotine dependence with nicotine-induced disorder Asked:confirms currently smokes cigarettes Assess: Unwilling to set a quit date, but is cutting back Advise: needs to QUIT to reduce risk of cancer, cardio and cerebrovascular disease Assist: counseled for 5 minutes and literature provided Arrange: follow up in 2 to 4 months   Back pain Controlled on current regime  Vitamin D deficiency Updated lab needed at/ before next visit.   Tubular adenoma of colon Colonoscopy past due pt  needs to be encouraged to have this done  Obesity (BMI 30.0-34.9)  Patient re-educated about  the importance of commitment to a  minimum of 150 minutes of exercise per week as able.  The importance of healthy food choices with portion control discussed, as well as eating regularly and within a 12 hour window most days. The need to choose "clean , green" food 50 to 75% of the time is discussed, as well as to make water the primary drink and set a goal of 64 ounces water daily.       12/28/2023   10:44 AM 03/09/2023   10:56 AM 09/01/2022    1:59 PM  Weight /BMI  Weight 189 lb 1.3  oz 189 lb 182 lb  Height 5\' 5"  (1.651 m) 5\' 5"  (1.651 m) 5\' 5"  (1.651 m)  BMI 31.46 kg/m2 31.45 kg/m2 30.29 kg/m2    unchanged

## 2023-12-30 NOTE — Addendum Note (Signed)
 Addended by: Kerri Perches on: 12/30/2023 09:19 PM   Modules accepted: Orders

## 2023-12-30 NOTE — Assessment & Plan Note (Signed)
Asked:confirms currently smokes cigarettes °Assess: Unwilling to set a quit date, but is cutting back °Advise: needs to QUIT to reduce risk of cancer, cardio and cerebrovascular disease °Assist: counseled for 5 minutes and literature provided °Arrange: follow up in 2 to 4 months ° °

## 2023-12-30 NOTE — Assessment & Plan Note (Signed)
 Controlled, no change in medication DASH diet and commitment to daily physical activity for a minimum of 30 minutes discussed and encouraged, as a part of hypertension management. The importance of attaining a healthy weight is also discussed.     12/28/2023   10:44 AM 03/09/2023   10:56 AM 09/01/2022    2:01 PM 09/01/2022    1:59 PM 07/05/2022    3:53 PM 07/05/2022    3:52 PM 07/05/2022    3:51 PM  BP/Weight  Systolic BP 129 122 124 154 125 115 111  Diastolic BP 78 74 78 75 72 68 67  Wt. (Lbs) 189.08 189  182     BMI 31.46 kg/m2 31.45 kg/m2  30.29 kg/m2

## 2023-12-30 NOTE — Assessment & Plan Note (Signed)
Controlled on current regime 

## 2023-12-30 NOTE — Assessment & Plan Note (Signed)
 Colonoscopy past due pt needs to be encouraged to have this done

## 2023-12-30 NOTE — Assessment & Plan Note (Signed)
  Patient re-educated about  the importance of commitment to a  minimum of 150 minutes of exercise per week as able.  The importance of healthy food choices with portion control discussed, as well as eating regularly and within a 12 hour window most days. The need to choose "clean , green" food 50 to 75% of the time is discussed, as well as to make water the primary drink and set a goal of 64 ounces water daily.       12/28/2023   10:44 AM 03/09/2023   10:56 AM 09/01/2022    1:59 PM  Weight /BMI  Weight 189 lb 1.3 oz 189 lb 182 lb  Height 5\' 5"  (1.651 m) 5\' 5"  (1.651 m) 5\' 5"  (1.651 m)  BMI 31.46 kg/m2 31.45 kg/m2 30.29 kg/m2    unchanged

## 2024-01-02 ENCOUNTER — Telehealth: Payer: Self-pay

## 2024-01-04 ENCOUNTER — Ambulatory Visit: Payer: Medicare HMO

## 2024-01-04 VITALS — BP 129/78 | Ht 65.0 in | Wt 180.0 lb

## 2024-01-04 DIAGNOSIS — Z0000 Encounter for general adult medical examination without abnormal findings: Secondary | ICD-10-CM

## 2024-01-04 DIAGNOSIS — Z2821 Immunization not carried out because of patient refusal: Secondary | ICD-10-CM

## 2024-01-04 DIAGNOSIS — Z5320 Procedure and treatment not carried out because of patient's decision for unspecified reasons: Secondary | ICD-10-CM

## 2024-01-04 NOTE — Patient Instructions (Signed)
 Amanda Simon , Thank you for taking time to come for your Medicare Wellness Visit. I appreciate your ongoing commitment to your health goals. Please review the following plan we discussed and let me know if I can assist you in the future.   Referrals/Orders/Follow-Ups/Clinician Recommendations: Follow up as scheduled for next annual wellness visit  This is a list of the screening recommended for you and due dates:  Health Maintenance  Topic Date Due   Colon Cancer Screening  02/20/2020   DTaP/Tdap/Td vaccine (2 - Td or Tdap) 12/30/2020   Mammogram  08/18/2021   COVID-19 Vaccine (7 - 2024-25 season) 12/08/2023   Flu Shot  04/20/2024   Medicare Annual Wellness Visit  01/03/2025   Pneumonia Vaccine  Completed   DEXA scan (bone density measurement)  Completed   Hepatitis C Screening  Completed   Zoster (Shingles) Vaccine  Completed   HPV Vaccine  Aged Out   Meningitis B Vaccine  Aged Out    Advanced directives: (Declined) Advance directive discussed with you today. Even though you declined this today, please call our office should you change your mind, and we can give you the proper paperwork for you to fill out.  Next Medicare Annual Wellness Visit scheduled for next year: Yes

## 2024-01-04 NOTE — Progress Notes (Signed)
 Because this visit was a virtual/telehealth visit,  certain criteria was not obtained, such a blood pressure, CBG if applicable, and timed get up and go. Any medications not marked as "taking" were not mentioned during the medication reconciliation part of the visit. Any vitals not documented were not able to be obtained due to this being a telehealth visit or patient was unable to self-report a recent blood pressure reading due to a lack of equipment at home via telehealth. Vitals that have been documented are verbally provided by the patient.  Subjective:   Amanda Simon is a 73 y.o. who presents for a Medicare Wellness preventive visit.  Visit Complete: Virtual I connected with  Amanda Simon on 01/04/24 by a audio enabled telemedicine application and verified that I am speaking with the correct person using two identifiers.  Patient Location: Home  Provider Location: Home Office  I discussed the limitations of evaluation and management by telemedicine. The patient expressed understanding and agreed to proceed.  Vital Signs: Because this visit was a virtual/telehealth visit, some criteria may be missing or patient reported. Any vitals not documented were not able to be obtained and vitals that have been documented are patient reported.  VideoDeclined- This patient declined Librarian, academic. Therefore the visit was completed with audio only.  Persons Participating in Visit: Patient.  AWV Questionnaire: No: Patient Medicare AWV questionnaire was not completed prior to this visit.  Cardiac Risk Factors include: advanced age (>38men, >62 women);smoking/ tobacco exposure;hypertension;dyslipidemia     Objective:    Today's Vitals   01/04/24 1128  BP: 129/78  Weight: 180 lb (81.6 kg)  Height: 5\' 5"  (1.651 m)  PainSc: 0-No pain   Body mass index is 29.95 kg/m.     01/04/2024   11:27 AM 02/08/2022    9:54 AM 01/28/2021    9:04 AM 01/14/2020    3:01 PM  06/29/2017    2:59 PM 02/20/2015    8:14 AM  Advanced Directives  Does Patient Have a Medical Advance Directive? No No No No No No  Would patient like information on creating a medical advance directive? No - Patient declined Yes (ED - Information included in AVS) No - Patient declined Yes (ED - Information included in AVS) Yes (MAU/Ambulatory/Procedural Areas - Information given) No - patient declined information    Current Medications (verified) Outpatient Encounter Medications as of 01/04/2024  Medication Sig   cholecalciferol (VITAMIN D3) 25 MCG (1000 UNIT) tablet Take 1,000 Units by mouth daily. Takes 5000 units daily   gabapentin (NEURONTIN) 100 MG capsule Take 1 capsule (100 mg total) by mouth 3 (three) times daily.   losartan-hydrochlorothiazide (HYZAAR) 100-25 MG tablet Take 1 tablet by mouth daily.   meclizine (ANTIVERT) 12.5 MG tablet Take 1 tablet (12.5 mg total) by mouth 3 (three) times daily as needed for dizziness.   simvastatin (ZOCOR) 40 MG tablet Take 1 tablet (40 mg total) by mouth at bedtime.   No facility-administered encounter medications on file as of 01/04/2024.    Allergies (verified) Metformin and related   History: Past Medical History:  Diagnosis Date   Depression    Depression, major, single episode, moderate (HCC) 08/27/2018   PHQ 9 score of 14 in 08/2018, not suicidal or homicidal, medication started and referred to telethewrapy   Hyperlipidemia    Hypertension    IGT (impaired glucose tolerance) 2014   Obesity    Osteopenia    Past Surgical History:  Procedure Laterality Date  ABDOMINAL HYSTERECTOMY  1975   fibroids   COLONOSCOPY N/A 02/20/2015   Procedure: COLONOSCOPY;  Surgeon: Ruby Corporal, MD;  Location: [facility];  Service: Endoscopy;  Laterality: N/A;  930   PARTIAL HYSTERECTOMY     TUBAL LIGATION     Family History  Problem Relation Age of Onset   Lung cancer Mother    Aneurysm Father        brain    Hypertension Sister     Hypertension Sister    Social History   Socioeconomic History   Marital status: Married    Spouse name: Not on file   Number of children: Not on file   Years of education: Not on file   Highest education level: Not on file  Occupational History   Occupation: unemployed   Tobacco Use   Smoking status: Some Days    Current packs/day: 0.00    Average packs/day: 0.3 packs/day for 35.0 years (8.8 ttl pk-yrs)    Types: Cigarettes    Start date: 03/01/1982    Last attempt to quit: 03/01/2017    Years since quitting: 6.8   Smokeless tobacco: Never   Tobacco comments:    2 per day   Substance and Sexual Activity   Alcohol use: No    Alcohol/week: 0.0 standard drinks of alcohol   Drug use: No   Sexual activity: Yes    Birth control/protection: Surgical  Other Topics Concern   Not on file  Social History Narrative   Not on file   Social Drivers of Health   Financial Resource Strain: Low Risk  (01/04/2024)   Overall Financial Resource Strain (CARDIA)    Difficulty of Paying Living Expenses: Not very hard  Food Insecurity: No Food Insecurity (01/04/2024)   Hunger Vital Sign    Worried About Running Out of Food in the Last Year: Never true    Ran Out of Food in the Last Year: Never true  Transportation Needs: No Transportation Needs (01/04/2024)   PRAPARE - Administrator, Civil Service (Medical): No    Lack of Transportation (Non-Medical): No  Physical Activity: Sufficiently Active (01/04/2024)   Exercise Vital Sign    Days of Exercise per Week: 7 days    Minutes of Exercise per Session: 30 min  Stress: No Stress Concern Present (01/28/2021)   [employer] of Occupational Health - Occupational Stress Questionnaire    Feeling of Stress : Not at all  Social Connections: Moderately Integrated (01/04/2024)   Social Connection and Isolation Panel [NHANES]    Frequency of Communication with Friends and Family: Twice a week    Frequency of Social Gatherings with Friends  and Family: Once a week    Attends Religious Services: 1 to 4 times per year    Active Member of [employer] or Organizations: No    Attends Engineer, structural: Never    Marital Status: Married    Tobacco Counseling Ready to quit: Not Answered Counseling given: Not Answered Tobacco comments: 2 per day     Clinical Intake:  Pre-visit preparation completed: Yes  Pain : No/denies pain Pain Score: 0-No pain     BMI - recorded: 29.95 Nutritional Status: BMI 25 -29 Overweight Nutritional Risks: None Diabetes: No  Lab Results  Component Value Date   HGBA1C 6.2 (H) 03/09/2023   HGBA1C 6.0 (H) 09/01/2022   HGBA1C 5.9 (H) 02/25/2022     How often do you need to have someone help you when  you read instructions, pamphlets, or other written materials from your doctor or pharmacy?: 1 - Never What is the last grade level you completed in school?: 9th grade     Information entered by :: Estell Harpin   Activities of Daily Living     01/04/2024   11:32 AM  In your present state of health, do you have any difficulty performing the following activities:  Hearing? 0  Vision? 0  Difficulty concentrating or making decisions? 0  Walking or climbing stairs? 0  Dressing or bathing? 0  Doing errands, shopping? 0  Preparing Food and eating ? N  Using the Toilet? N  In the past six months, have you accidently leaked urine? N  Do you have problems with loss of bowel control? N  Managing your Medications? N  Managing your Finances? N  Housekeeping or managing your Housekeeping? N    Patient Care Team: Kerri Perches, MD as PCP - General  Indicate any recent Medical Services you may have received from other than [facility] providers in the past year (date may be approximate).     Assessment:   This is a routine wellness examination for [place].  Hearing/Vision screen Hearing Screening - Comments:: No hearing issues Vision Screening - Comments:: Patient wears  glasses   Goals Addressed             This Visit's Progress    Exercise 3x per week (30 min per time)   On track    Recommend increasing your exercise program to at least 3 days a week for 30-45 minutes at a time as tolerated.         Depression Screen     12/28/2023   10:45 AM 03/09/2023   10:57 AM 09/01/2022    2:00 PM 07/20/2022   10:56 AM 02/25/2022   10:53 AM 12/24/2021   11:29 AM 06/24/2021    8:40 AM  PHQ 2/9 Scores  PHQ - 2 Score 0 0 2 0 1 1 0  PHQ- 9 Score  1 3        Fall Risk     01/04/2024   11:41 AM 01/04/2024   11:32 AM 12/28/2023   10:45 AM 03/09/2023   10:57 AM 09/01/2022    2:00 PM  Fall Risk   Falls in the past year? 0 0 0 1 0  Number falls in past yr: 0 0 0 0 0  Injury with Fall? 0 0 0 0 0  Risk for fall due to : No Fall Risks No Fall Risks  History of fall(s) No Fall Risks  Follow up Falls prevention discussed;Falls evaluation completed  Falls evaluation completed Falls evaluation completed Falls evaluation completed    MEDICARE RISK AT HOME:  Medicare Risk at Home Any stairs in or around the home?: Yes If so, are there any without handrails?: No Home free of loose throw rugs in walkways, pet beds, electrical cords, etc?: Yes Adequate lighting in your home to reduce risk of falls?: Yes Life alert?: No Use of a cane, walker or w/c?: Yes (cane) Grab bars in the bathroom?: Yes Shower chair or bench in shower?: Yes Elevated toilet seat or a handicapped toilet?: No  TIMED UP AND GO:  Was the test performed?  No  Cognitive Function: 6CIT completed        01/04/2024   11:29 AM 01/14/2020    3:02 PM 01/09/2019    9:32 AM 06/29/2017    3:02 PM  6CIT Screen  What Year? 0 points 0 points 0 points 0 points  What month? 0 points 0 points 0 points 0 points  What time? 0 points 0 points 0 points 0 points  Count back from 20 0 points 0 points 0 points 0 points  Months in reverse 0 points 0 points 0 points 0 points  Repeat phrase 0 points 0 points 0  points 0 points  Total Score 0 points 0 points 0 points 0 points    Immunizations Immunization History  Administered Date(s) Administered   Fluad Quad(high Dose 65+) 05/24/2019, 06/18/2020, 06/24/2021, 07/22/2022   Influenza Whole 08/02/2011   Influenza, High Dose Seasonal PF 08/24/2018   Influenza,inj,Quad PF,6+ Mos 06/11/2013, 09/24/2014, 07/22/2015, 08/19/2016, 06/29/2017   Moderna Covid-19 Vaccine Bivalent Booster 23yrs & up 04/26/2022   PFIZER Comirnaty(Gray Top)Covid-19 Tri-Sucrose Vaccine 03/31/2021   PFIZER(Purple Top)SARS-COV-2 Vaccination 11/01/2019, 11/26/2019, 10/09/2020   PNEUMOCOCCAL CONJUGATE-20 04/08/2022   Pfizer(Comirnaty)Fall Seasonal Vaccine 12 years and older 06/10/2023   Pneumococcal Conjugate-13 02/05/2015   Pneumococcal Polysaccharide-23 10/21/2016   Tdap 12/31/2010   Zoster Recombinant(Shingrix) 07/16/2021, 03/02/2022   Zoster, Live 12/31/2010    Screening Tests Health Maintenance  Topic Date Due   Colonoscopy  02/20/2020   DTaP/Tdap/Td (2 - Td or Tdap) 12/30/2020   MAMMOGRAM  08/18/2021   COVID-19 Vaccine (7 - 2024-25 season) 12/08/2023   INFLUENZA VACCINE  04/20/2024   Medicare Annual Wellness (AWV)  01/03/2025   Pneumonia Vaccine 83+ Years old  Completed   DEXA SCAN  Completed   Hepatitis C Screening  Completed   Zoster Vaccines- Shingrix  Completed   HPV VACCINES  Aged Out   Meningococcal B Vaccine  Aged Out    Health Maintenance  Health Maintenance Due  Topic Date Due   Colonoscopy  02/20/2020   DTaP/Tdap/Td (2 - Td or Tdap) 12/30/2020   MAMMOGRAM  08/18/2021   COVID-19 Vaccine (7 - 2024-25 season) 12/08/2023   Health Maintenance Items Addressed:patient states she wants to decline the vaccinations at this time . Patient is going to talk to PCP about the Cologuard kit.  Additional Screening:  Vision Screening: Recommended annual ophthalmology exams for early detection of glaucoma and other disorders of the eye.  Dental Screening:  Recommended annual dental exams for proper oral hygiene  Community Resource Referral / Chronic Care Management: CRR required this visit?  No   CCM required this visit?  No     Plan:     I have personally reviewed and noted the following in the patient's chart:   Medical and social history Use of alcohol, tobacco or illicit drugs  Current medications and supplements including opioid prescriptions. Patient is not currently taking opioid prescriptions. Functional ability and status Nutritional status Physical activity Advanced directives List of other physicians Hospitalizations, surgeries, and ER visits in previous 12 months Vitals Screenings to include cognitive, depression, and falls Referrals and appointments  In addition, I have reviewed and discussed with patient certain preventive protocols, quality metrics, and best practice recommendations. A written personalized care plan for preventive services as well as general preventive health recommendations were provided to patient.     Rudi Heap, [place]   01/04/2024   After Visit Summary: (MyChart) Due to this being a telephonic visit, the after visit summary with patients personalized plan was offered to patient via MyChart   Notes: Nothing significant to report at this time.

## 2024-01-16 ENCOUNTER — Ambulatory Visit (HOSPITAL_COMMUNITY)

## 2024-03-12 NOTE — Telephone Encounter (Signed)
 Per Dr. Antonetta, tried calling pt to confirm if she still wants Meclizine sent to phamracy as discussed in 04/09 OV. LVM to CB

## 2024-04-30 ENCOUNTER — Telehealth: Payer: Self-pay | Admitting: Family Medicine

## 2024-04-30 NOTE — Telephone Encounter (Signed)
 Rescheduled patient physical, she just want to make sure she has enough medications  simvastatin  (ZOCOR ) 40 MG tablet [[id number]]   losartan -hydrochlorothiazide (HYZAAR) 100-25 MG tablet [[id number]]     Pharmacy: Pharmacy  [facility] [address], [place] - [address] [place] [address] Phone: [phone]  Fax: [phone] DEA #: [id number]

## 2024-04-30 NOTE — Telephone Encounter (Signed)
 Has enough until next April

## 2024-05-22 ENCOUNTER — Telehealth: Payer: Self-pay

## 2024-05-22 ENCOUNTER — Other Ambulatory Visit: Payer: Self-pay

## 2024-05-22 DIAGNOSIS — Z23 Encounter for immunization: Secondary | ICD-10-CM

## 2024-05-22 MED ORDER — UNABLE TO FIND: 0 refills | Status: AC

## 2024-05-22 NOTE — Telephone Encounter (Signed)
 Faxed, pt informed

## 2024-05-22 NOTE — Telephone Encounter (Signed)
 Copied from CRM [id number]. Topic: Clinical - Request for Lab/Test Order >> May 22, 2024 12:00 PM Mia F wrote: Reason for CRM: [facility] and was told she need a rx script from her provider. If this can be done please contact pt when ready

## 2024-06-06 ENCOUNTER — Encounter: Admitting: Family Medicine

## 2024-07-30 ENCOUNTER — Other Ambulatory Visit: Payer: Self-pay | Admitting: Family Medicine

## 2024-07-30 MED ORDER — SIMVASTATIN 40 MG PO TABS: 40.0000 mg | ORAL_TABLET | Freq: Every day | ORAL | 3 refills | Status: DC

## 2024-07-30 MED ORDER — LOSARTAN POTASSIUM-HCTZ 100-25 MG PO TABS: 1.0000 | ORAL_TABLET | Freq: Every day | ORAL | 3 refills | Status: DC

## 2024-07-30 NOTE — Telephone Encounter (Signed)
 Copied from CRM [id number]. Topic: Clinical - Medication Refill >> Jul 30, 2024 10:30 AM Amanda Simon wrote: Medication: losartan -hydrochlorothiazide (HYZAAR) 100-25 MG tablet simvastatin  (ZOCOR ) 40 MG tablet  Has the patient contacted their pharmacy? Yes (Agent: If no, request that the patient contact the pharmacy for the refill. If patient does not wish to contact the pharmacy document the reason why and proceed with request.) (Agent: If yes, when and what did the pharmacy advise?)  This is the patient's preferred pharmacy:  [id number] [place], [place] - [address] [place] [address] Phone: [phone] Fax: [phone]  Is this the correct pharmacy for this prescription? Yes If no, delete pharmacy and type the correct one.   Has the prescription been filled recently? No  Is the patient out of the medication? Yes  Has the patient been seen for an appointment in the last year OR does the patient have an upcoming appointment? Yes  Can we respond through MyChart? Yes  Agent: Please be advised that Rx refills may take up to 3 business days. We ask that you follow-up with your pharmacy.

## 2024-08-02 ENCOUNTER — Other Ambulatory Visit: Payer: Self-pay | Admitting: Family Medicine

## 2024-08-02 ENCOUNTER — Telehealth: Payer: Self-pay | Admitting: Family Medicine

## 2024-08-02 MED ORDER — SIMVASTATIN 40 MG PO TABS
40.0000 mg | ORAL_TABLET | Freq: Every day | ORAL | 3 refills | Status: AC
Start: 2024-08-02 — End: ?

## 2024-08-02 MED ORDER — LOSARTAN POTASSIUM-HCTZ 100-25 MG PO TABS: 1.0000 | ORAL_TABLET | Freq: Every day | ORAL | 3 refills | Status: AC

## 2024-08-02 NOTE — Telephone Encounter (Signed)
 Copied from CRM #[id number]. Topic: Clinical - Medication Refill >> Aug 02, 2024  1:24 PM Charlet HERO wrote: Medication: losartan -hydrochlorothiazide (HYZAAR) 100-25 MG tablet simvastatin  (ZOCOR ) 40 MG tablet Has the patient contacted their pharmacy? Yes 0 refills  This is the patient's preferred pharmacy:  [id number] [place], [place] - [address] [place] [address] Phone: [phone] Fax: [phone]  Is this the correct pharmacy for this prescription? No If no, delete pharmacy and type the correct one.   Has the prescription been filled recently? Yes  Is the patient out of the medication? No  Has the patient been seen for an appointment in the last year OR does the patient have an upcoming appointment? Yes  Can we respond through MyChart? Yes  Agent: Please be advised that Rx refills may take up to 3 business days. We ask that you follow-up with your pharmacy.

## 2024-08-06 ENCOUNTER — Encounter: Admitting: Family Medicine

## 2024-09-10 NOTE — Progress Notes (Signed)
 Pharmacy Quality Measure Review  This patient is appearing on a report for being at risk of failing the adherence measure for cholesterol (statin) and hypertension (ACEi/ARB) medications this calendar year.   Medication: Losartan -hydrochlorothiazide 100-25 mg Last fill date: 08/15/24 for 90 day supply, sold 08/27/24  Medication: Simvastatin  40 mg Last fill date: 08/15/24 for 08/27/24 day supply  Insurance report was not up to date. No action needed at this time.   Jenkins Graces, PharmD PGY1 Pharmacy Resident

## 2024-09-19 ENCOUNTER — Encounter: Admitting: Family Medicine

## 2025-01-08 ENCOUNTER — Ambulatory Visit
